# Patient Record
Sex: Male | Born: 1940 | Race: White | Hispanic: No | Smoking: Never smoker
Health system: Southern US, Community
[De-identification: ages and names within clinical notes are randomized; demographics above are authoritative.]

## PROBLEM LIST (undated history)

## (undated) DIAGNOSIS — I10 Essential (primary) hypertension: Secondary | ICD-10-CM

## (undated) DIAGNOSIS — M1990 Unspecified osteoarthritis, unspecified site: Secondary | ICD-10-CM

## (undated) DIAGNOSIS — F0390 Unspecified dementia without behavioral disturbance: Secondary | ICD-10-CM

## (undated) DIAGNOSIS — E079 Disorder of thyroid, unspecified: Secondary | ICD-10-CM

## (undated) HISTORY — PX: BACK SURGERY: SHX140

## (undated) HISTORY — PX: JOINT REPLACEMENT: SHX530

## (undated) HISTORY — PX: ROTATOR CUFF REPAIR: SHX139

## (undated) HISTORY — PX: CHOLECYSTECTOMY: SHX55

---

## 2016-11-23 ENCOUNTER — Ambulatory Visit
Admission: EM | Admit: 2016-11-23 | Discharge: 2016-11-23 | Disposition: A | Discharge status: Home / Self Care | Payer: Medicare Other | Attending: Internal Medicine | Admitting: Internal Medicine

## 2016-11-23 ENCOUNTER — Encounter: Payer: Self-pay | Admitting: *Deleted

## 2016-11-23 DIAGNOSIS — L0291 Cutaneous abscess, unspecified: Secondary | ICD-10-CM

## 2016-11-23 HISTORY — DX: Essential (primary) hypertension: I10

## 2016-11-23 HISTORY — DX: Disorder of thyroid, unspecified: E07.9

## 2016-11-23 HISTORY — DX: Unspecified osteoarthritis, unspecified site: M19.90

## 2016-11-23 MED ORDER — HYDROCODONE-ACETAMINOPHEN 5-325 MG PO TABS: 1.0000 | ORAL_TABLET | Freq: Four times a day (QID) | ORAL | 0 refills | Status: DC | PRN

## 2016-11-23 MED ORDER — DOXYCYCLINE HYCLATE 100 MG PO CAPS: 100.0000 mg | ORAL_CAPSULE | Freq: Two times a day (BID) | ORAL | 0 refills | Status: DC

## 2016-11-23 NOTE — ED Triage Notes (Signed)
Abcess to base of neck right shoulder. Pt states abcess has been there for years but just recently become painful and much larger.

## 2016-11-23 NOTE — ED Provider Notes (Signed)
CSN: [id number]     Arrival date & time 11/23/16  [id number] History   First MD Initiated Contact with Patient 11/23/16 0920     Chief Complaint  Patient presents with  . Abscess   (Consider location/radiation/quality/duration/timing/severity/associated sxs/prior Treatment) HPI  This a pleasant 75 year old male accompanied by his wife who are visiting from [place] who presents with a abscess on the back of his neck on the right. His wife states that he has had this abscess for 20 years or so that has never been this bothersome or painful. She has been attempting to squeeze it and has been able to express some pus but it is only worsened. He denies any fever or chills. They're planning to return to [place] today      Past Medical History:  Diagnosis Date  . Arthritis   . Hypertension   . Thyroid disease    Past Surgical History:  Procedure Laterality Date  . BACK SURGERY    . CHOLECYSTECTOMY    . JOINT REPLACEMENT     History reviewed. No pertinent family history. Social History  Substance Use Topics  . Smoking status: Never Smoker  . Smokeless tobacco: Never Used  . Alcohol use Yes    Review of Systems  Constitutional: Negative for activity change, appetite change, chills, fatigue and fever.  Skin: Positive for wound.  All other systems reviewed and are negative.   Allergies  Aspirin and Septra [sulfamethoxazole-trimethoprim]  Home Medications   Prior to Admission medications   Medication Sig Start Date End Date Taking? Authorizing Provider  allopurinol (ZYLOPRIM) 300 MG tablet Take 300 mg by mouth daily.   Yes Historical Provider, MD  levothyroxine (SYNTHROID, LEVOTHROID) 75 MCG tablet Take 75 mcg by mouth daily before breakfast.   Yes Historical Provider, MD  lisinopril (PRINIVIL,ZESTRIL) 10 MG tablet Take 10 mg by mouth daily.   Yes Historical Provider, MD  doxycycline (VIBRAMYCIN) 100 MG capsule Take 1 capsule (100 mg total) by mouth 2 (two) times daily.  11/23/16   Lutricia FeilWilliam P Roemer, PA-C  HYDROcodone-acetaminophen (NORCO/VICODIN) 5-325 MG tablet Take 1 tablet by mouth every 6 (six) hours as needed for severe pain. 11/23/16   Lutricia FeilWilliam P Roemer, PA-C   Meds Ordered and Administered this Visit  Medications - No data to display  BP 132/71 (BP Location: Left Arm)   Pulse 66   Temp 98.1 F (36.7 C) (Oral)   Resp 16   Ht 5\' 8"  (1.727 m)   Wt 181 lb (82.1 kg)   SpO2 99%   BMI 27.52 kg/m  No data found.   Physical Exam  Constitutional: He is oriented to person, place, and time. He appears well-developed and well-nourished. No distress.  HENT:  Head: Normocephalic and atraumatic.  Right Ear: External ear normal.  Left Ear: External ear normal.  Nose: Nose normal.  Mouth/Throat: Oropharynx is clear and moist.  Eyes: EOM are normal. Pupils are equal, round, and reactive to light.  Neck: Normal range of motion. Neck supple.  Musculoskeletal: Normal range of motion. He exhibits no edema.  Neurological: He is alert and oriented to person, place, and time.  Skin: Skin is warm and dry. He is not diaphoretic.  Examination of the right posterior neck just superior border of the right trapezial muscle is a tense but fluctuant indurated cyst with several small pustules over the surface. It is warm. No purulence is able to be expressed digitally. It measures approximately 2-1/2 cm in diameter with more fluctuance  extending towards the midline.  Psychiatric: He has a normal mood and affect. His behavior is normal. Judgment and thought content normal.  Nursing note and vitals reviewed.   Urgent Care Course   Clinical Course     .Marland KitchenIncision and Drainage Date/Time: 11/23/2016 10:07 AM Performed by: Lutricia Feil Authorized by: Eustace Moore   Consent:    Consent obtained:  Verbal   Consent given by:  Patient   Risks discussed:  Bleeding, incomplete drainage, pain and infection   Alternatives discussed:  Alternative treatment and  referral Location:    Type:  Abscess   Size:  Centimeter in diameter large indurated fluctuant abscess with several smaller pustules on the surface. The induration high and fluctuance extends towards midline for approximately one half more centimeters.    Location:  Neck   Neck location:  R posterior Pre-procedure details:    Skin preparation:  Betadine Anesthesia (see MAR for exact dosages):    Anesthesia method:  Local infiltration   Local anesthetic:  Lidocaine 1% w/o epi Procedure type:    Complexity:  Complex Procedure details:    Needle aspiration: no     Incision types:  Stab incision and single straight   Incision depth:  Subcutaneous   Scalpel blade:  11   Wound management:  Probed and deloculated   Drainage:  Bloody and purulent   Drainage amount:  Moderate   Wound treatment:  Wound left open and drain placed   Packing materials:  1/4 in gauze   Amount 1/4":  Approximately 4 inches Post-procedure details:    Patient tolerance of procedure:  Tolerated well, no immediate complications Comments:     Patient will keep the area clean and dry for the next 48 hours. We are returning to their home in [facility] and will follow up with their primary care or urgent care in that area. Advised them that they will need to have this reevaluated in several days effectiveness of the antibiotic as well as for removal and possible repacking due to the size and the depth of the abscess. I have given him 6 hydrocodone for pain to be used as necessary. They will try to control pain with Tylenol plain if possible. We'll start him on doxycycline since he has an allergy to Septra to augment the drainage of the abscess since it was so large and deep. If he develops any fever or chills he will go to the emergency room immediately. The patient tolerated the procedure well and left the facility in stable condition   (including critical care time)  Labs Review Labs Reviewed - No data to  display  Imaging Review No results found.   Visual Acuity Review  Right Eye Distance:   Left Eye Distance:   Bilateral Distance:    Right Eye Near:   Left Eye Near:    Bilateral Near:         MDM   1. Abscess    Discharge Medication List as of 11/23/2016 10:01 AM    START taking these medications   Details  doxycycline (VIBRAMYCIN) 100 MG capsule Take 1 capsule (100 mg total) by mouth 2 (two) times daily., Starting Tue 11/23/2016, Print    HYDROcodone-acetaminophen (NORCO/VICODIN) 5-325 MG tablet Take 1 tablet by mouth every 6 (six) hours as needed for severe pain., Starting Tue 11/23/2016, Print      Plan: 1. Test/x-ray results and diagnosis reviewed with patient 2. rx as per orders; risks, benefits, potential side  effects reviewed with patient 3. Recommend supportive treatment with Even clean and dry augmenting the dressings if they become bloodsoaked. Is any question that should follow-up with an emergency room. Otherwise he should receive additional care in his home town from an urgent care or his primary care physician within 48 hours 4. F/u prn if symptoms worsen or don't improve     Lutricia FeilWilliam P Roemer, PA-C 11/23/16 1014

## 2017-06-06 ENCOUNTER — Other Ambulatory Visit: Payer: Self-pay | Admitting: Orthopedic Surgery

## 2017-06-06 DIAGNOSIS — G8929 Other chronic pain: Secondary | ICD-10-CM

## 2017-06-06 DIAGNOSIS — M545 Low back pain, unspecified: Secondary | ICD-10-CM

## 2017-06-06 DIAGNOSIS — M5440 Lumbago with sciatica, unspecified side: Secondary | ICD-10-CM

## 2017-06-13 ENCOUNTER — Ambulatory Visit
Admission: RE | Admit: 2017-06-13 | Discharge: 2017-06-13 | Disposition: A | Discharge status: Home / Self Care | Payer: Medicare Other | Source: Ambulatory Visit | Attending: Orthopedic Surgery | Admitting: Orthopedic Surgery

## 2017-06-13 DIAGNOSIS — G8929 Other chronic pain: Secondary | ICD-10-CM | POA: Insufficient documentation

## 2017-06-13 DIAGNOSIS — M545 Low back pain, unspecified: Secondary | ICD-10-CM

## 2017-06-13 DIAGNOSIS — M47816 Spondylosis without myelopathy or radiculopathy, lumbar region: Secondary | ICD-10-CM | POA: Diagnosis not present

## 2017-06-13 DIAGNOSIS — M5440 Lumbago with sciatica, unspecified side: Secondary | ICD-10-CM | POA: Diagnosis not present

## 2017-06-13 DIAGNOSIS — M48061 Spinal stenosis, lumbar region without neurogenic claudication: Secondary | ICD-10-CM | POA: Diagnosis not present

## 2017-06-13 DIAGNOSIS — M5127 Other intervertebral disc displacement, lumbosacral region: Secondary | ICD-10-CM | POA: Insufficient documentation

## 2018-01-13 IMAGING — MR MR LUMBAR SPINE W/O CM
5 series · 37 of 48 positions shown · non-contrast
Comparison: None.

CLINICAL DATA: Previous injury 30 years ago.  Pain.

EXAM:
MRI LUMBAR SPINE WITHOUT CONTRAST
TECHNIQUE: Multiplanar, multisequence MR imaging of the lumbar spine was
performed. No intravenous contrast was administered.

[Series 2: T2 · sagittal · 4.0mm · 0.81mm/px · 6 of 15 slices shown (1 of 2)]
[im 1/15]
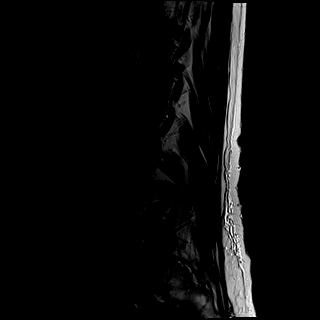
[im 3/15]
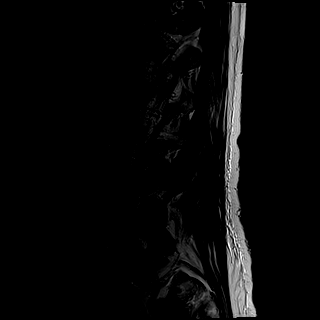
[im 6/15]
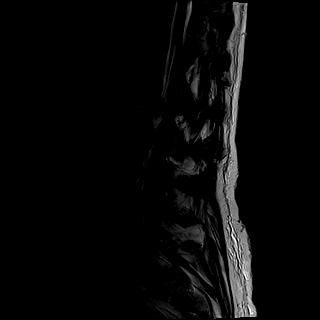
[im 9/15]
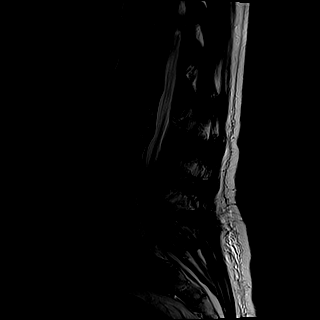
[im 12/15]
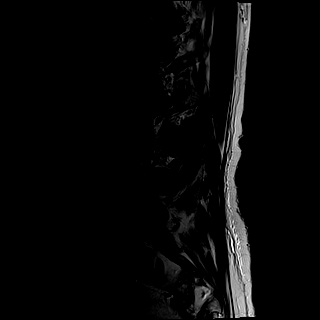
[im 15/15]
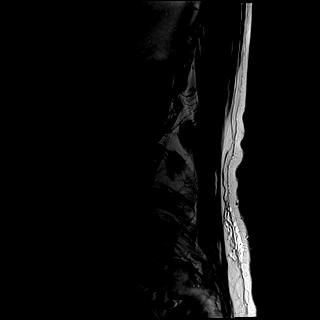

[Series 3: T1 · sagittal · 4.0mm · 0.81mm/px · 6 of 15 slices shown (1 of 2)]
[im 1/15]
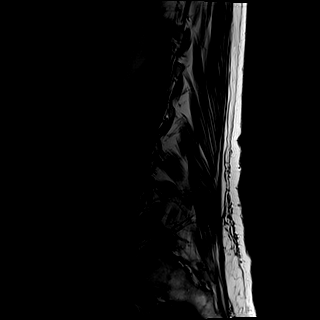
[im 3/15]
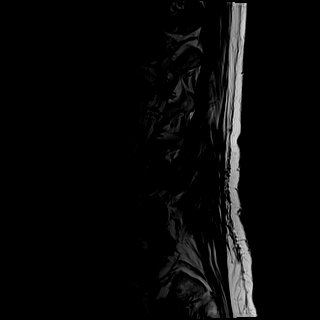
[im 6/15]
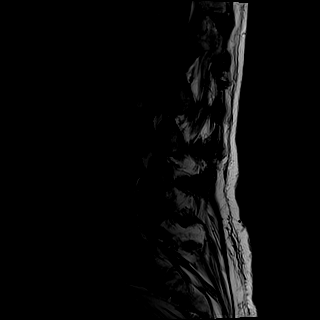
[im 9/15]
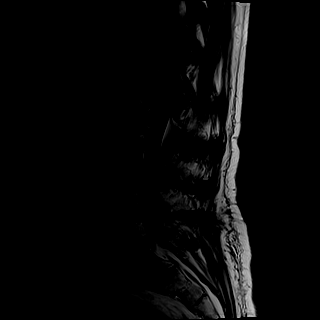
[im 12/15]
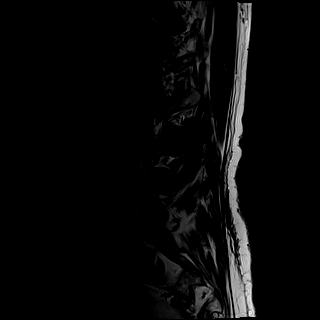
[im 15/15]
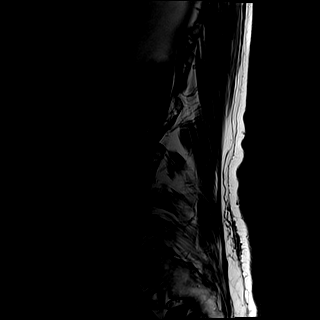

[Series 4: STIR · sagittal · 4.0mm · 1.02mm/px · 6 of 15 slices shown]
[im 1/15]
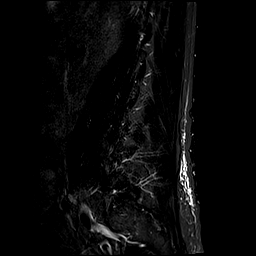
[im 3/15]
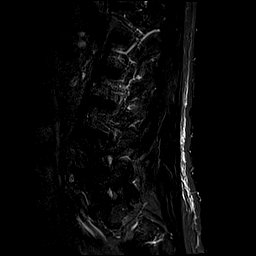
[im 6/15]
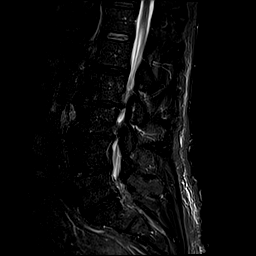
[im 9/15]
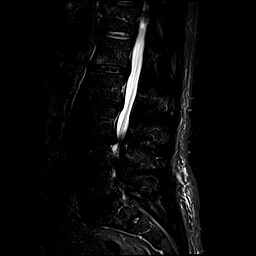
[im 12/15]
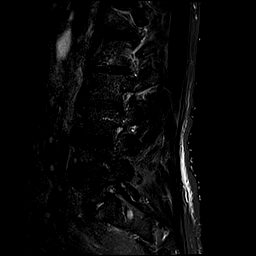
[im 15/15]
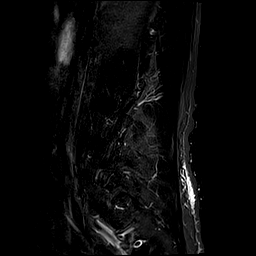

[Series 5: T2 · axial · 4.0mm · 0.78mm/px · z∈[-126,+97]mm · 10 of 39 slices shown (2 of 2)]
[im 1/39]
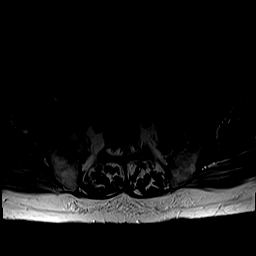
[im 3/39]
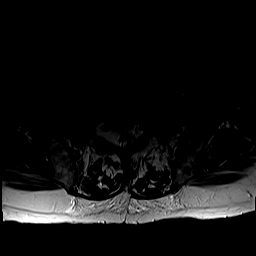
[im 6/39]
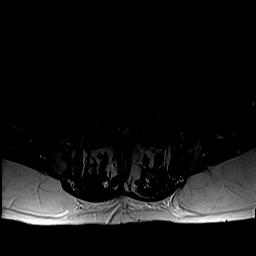
[im 11/39]
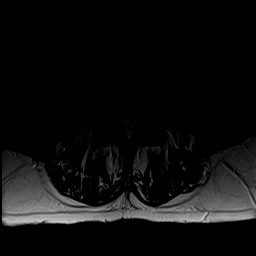
[im 17/39]
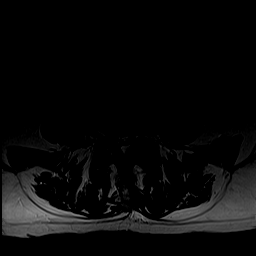
[im 20/39]
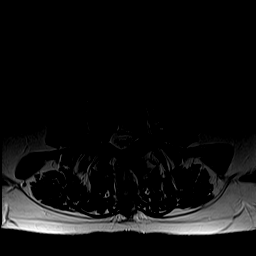
[im 22/39]
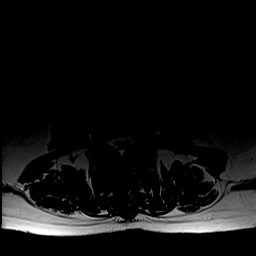
[im 28/39]
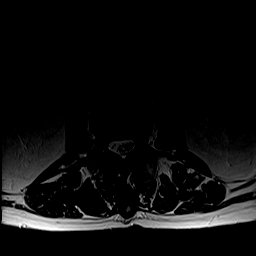
[im 33/39]
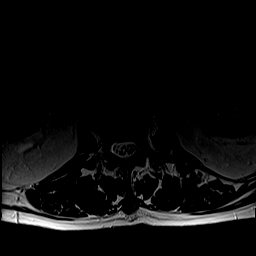
[im 39/39]
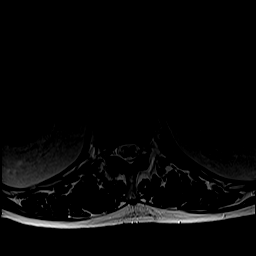

[Series 6: T1 · axial · 4.0mm · 0.39mm/px · z∈[-126,+97]mm · 9 of 39 slices shown (2 of 2)]
[im 1/39]
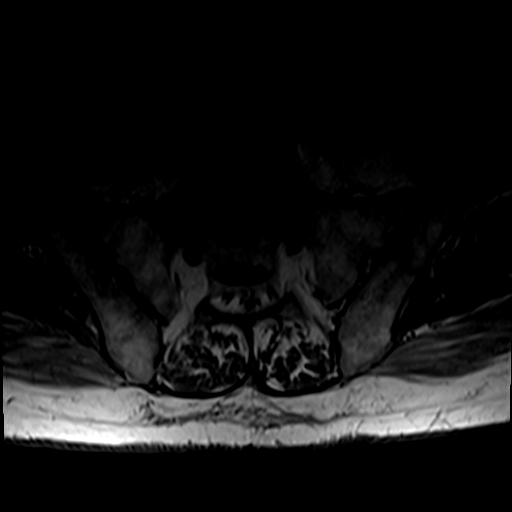
[im 6/39]
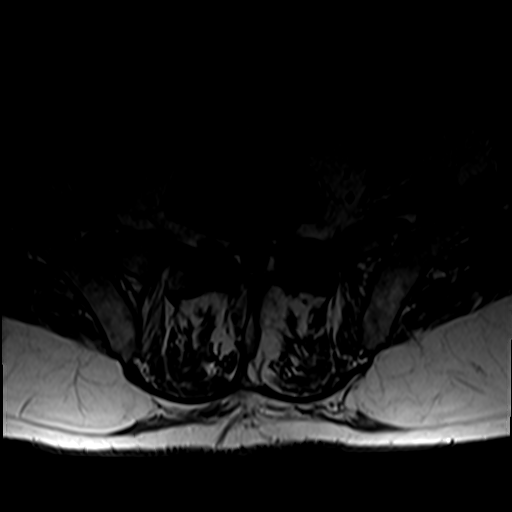
[im 11/39]
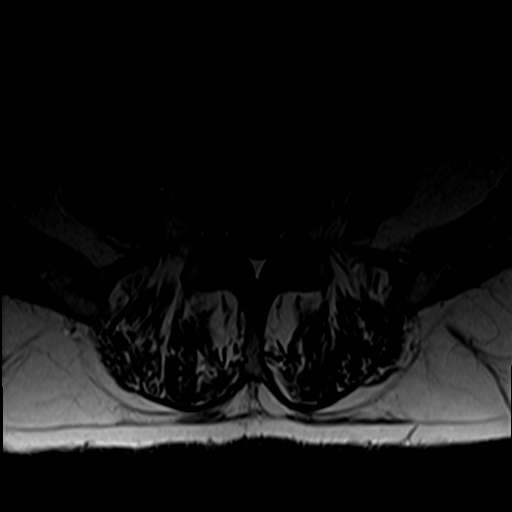
[im 17/39]
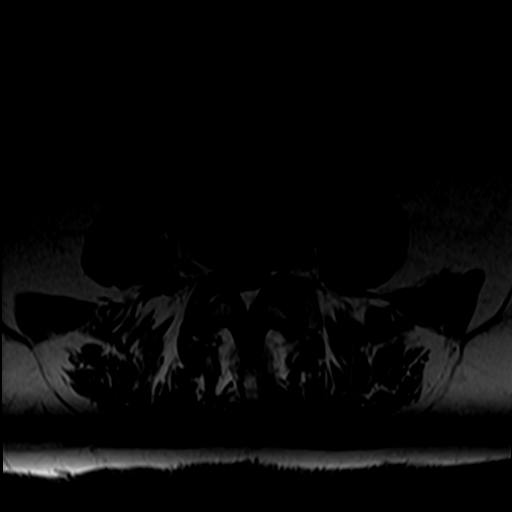
[im 20/39]
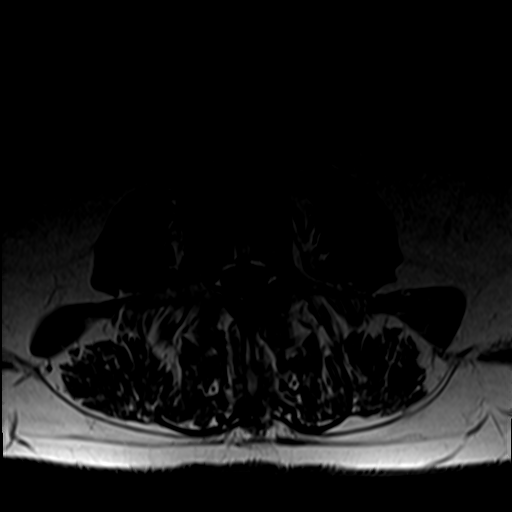
[im 22/39]
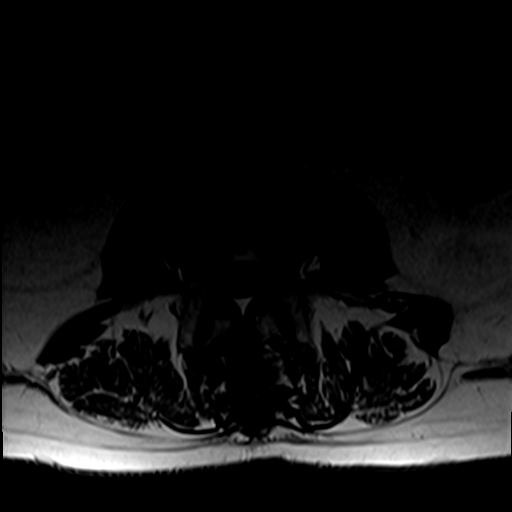
[im 28/39]
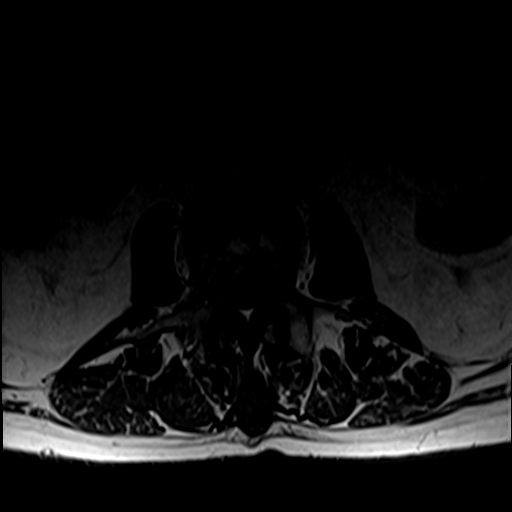
[im 33/39]
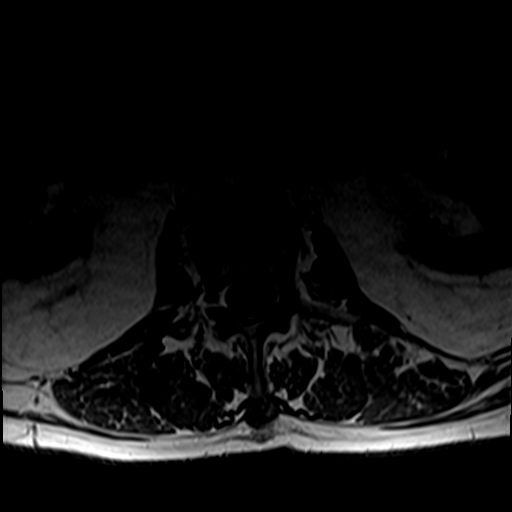
[im 39/39]
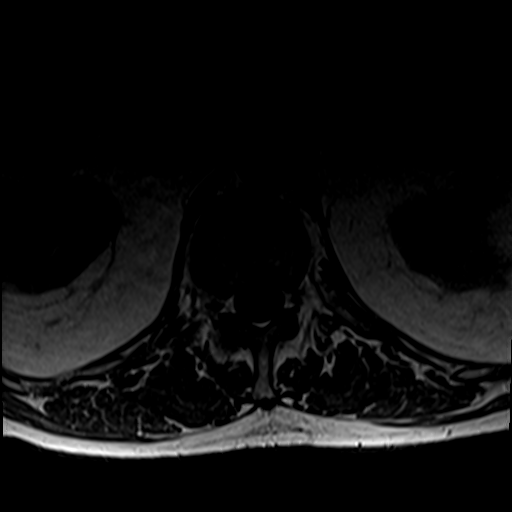

[37 of 48 positions shown; findings below may reference images not displayed]

FINDINGS: Segmentation:  Standard.

Alignment: 2 mm retrolisthesis L2-3 and L3-4. This is facet
mediated.

Vertebrae: Degenerative endplate change. No worrisome osseous
lesion.

Conus medullaris: Extends to the L1 level and appears normal.

Paraspinal and other soft tissues: No hydronephrosis. Intraspinous
bursa edema at L2-3 and L3-4.

Disc levels:

L1-L2:  Annular bulge.  Facet arthropathy.  No impingement.

L2-L3: Disc space narrowing. 2 mm retrolisthesis. Far-lateral and
foraminal protrusion on the LEFT. Posterior element hypertrophy.
LEFT L2 and LEFT L3 nerve root impingement are noted.

L3-L4: Disc space narrowing. 2 mm retrolisthesis. Central and
rightward protrusion. Posterior element hypertrophy. Moderate to
severe stenosis. RIGHT greater than LEFT L4 nerve root impingement
due to subarticular zone narrowing. No significant foraminal
narrowing.

L4-L5: Disc space narrowing. Osseous spurring. Central protrusion.
Posterior element hypertrophy. Moderate to severe stenosis.
BILATERAL subarticular zone narrowing affecting the L5 nerve roots.
There is a foraminal and extraforaminal synovial cyst on the RIGHT,
5 x 10 mm cross-section, resulting in RIGHT L4 nerve root
compression.

L5-S1: Annular rent. Central and rightward protrusion. Posterior
element hypertrophy. LEFT laminotomy. Possible BILATERAL S1 nerve
root irritation, worse on the RIGHT.
IMPRESSION: Multilevel spondylosis of a moderate to severe nature. Potentially
symptomatic neural impingement from L2-3 through L5-S1. Discussion
above.

## 2022-10-19 ENCOUNTER — Encounter (INDEPENDENT_AMBULATORY_CARE_PROVIDER_SITE_OTHER): Payer: Medicare PPO | Admitting: Ophthalmology

## 2022-10-19 DIAGNOSIS — H43813 Vitreous degeneration, bilateral: Secondary | ICD-10-CM | POA: Diagnosis not present

## 2022-10-19 DIAGNOSIS — H35033 Hypertensive retinopathy, bilateral: Secondary | ICD-10-CM | POA: Diagnosis not present

## 2022-10-19 DIAGNOSIS — H353134 Nonexudative age-related macular degeneration, bilateral, advanced atrophic with subfoveal involvement: Secondary | ICD-10-CM

## 2022-10-19 DIAGNOSIS — H26492 Other secondary cataract, left eye: Secondary | ICD-10-CM

## 2022-10-19 DIAGNOSIS — I10 Essential (primary) hypertension: Secondary | ICD-10-CM | POA: Diagnosis not present

## 2022-11-02 ENCOUNTER — Encounter (INDEPENDENT_AMBULATORY_CARE_PROVIDER_SITE_OTHER): Payer: Medicare PPO | Admitting: Ophthalmology

## 2023-01-09 ENCOUNTER — Emergency Department (HOSPITAL_COMMUNITY)
Admission: EM | Admit: 2023-01-09 | Discharge: 2023-01-09 | Disposition: A | Discharge status: Home / Self Care | Payer: Medicare PPO | Attending: Emergency Medicine | Admitting: Emergency Medicine

## 2023-01-09 ENCOUNTER — Encounter (HOSPITAL_COMMUNITY): Payer: Self-pay | Admitting: *Deleted

## 2023-01-09 ENCOUNTER — Other Ambulatory Visit: Payer: Self-pay

## 2023-01-09 DIAGNOSIS — I10 Essential (primary) hypertension: Secondary | ICD-10-CM | POA: Insufficient documentation

## 2023-01-09 DIAGNOSIS — T50911A Poisoning by multiple unspecified drugs, medicaments and biological substances, accidental (unintentional), initial encounter: Secondary | ICD-10-CM | POA: Insufficient documentation

## 2023-01-09 DIAGNOSIS — Z79899 Other long term (current) drug therapy: Secondary | ICD-10-CM | POA: Diagnosis not present

## 2023-01-09 DIAGNOSIS — T50901A Poisoning by unspecified drugs, medicaments and biological substances, accidental (unintentional), initial encounter: Secondary | ICD-10-CM

## 2023-01-09 LAB — BASIC METABOLIC PANEL
Anion gap: 8 (ref 5–15)
BUN: 15 mg/dL (ref 8–23)
CO2: 25 mmol/L (ref 22–32)
Calcium: 8.9 mg/dL (ref 8.9–10.3)
Chloride: 105 mmol/L (ref 98–111)
Creatinine, Ser: 0.9 mg/dL (ref 0.61–1.24)
GFR, Estimated: >60 mL/min (ref >60)
Glucose, Bld: 143 mg/dL — ABNORMAL HIGH (ref 70–99)
Potassium: 4.3 mmol/L (ref 3.5–5.1)
Sodium: 138 mmol/L (ref 135–145)

## 2023-01-09 LAB — CBC
HCT: 45.8 % (ref 39.0–52.0)
Hemoglobin: 14.8 g/dL (ref 13.0–17.0)
MCH: 30.7 pg (ref 26.0–34.0)
MCHC: 32.3 g/dL (ref 30.0–36.0)
MCV: 95 fL (ref 80.0–100.0)
Platelets: 196 10*3/uL (ref 150–400)
RBC: 4.82 MIL/uL (ref 4.22–5.81)
RDW: 14 % (ref 11.5–15.5)
WBC: 6.7 10*3/uL (ref 4.0–10.5)
nRBC: 0 % (ref 0.0–0.2)

## 2023-01-09 MED ORDER — SODIUM CHLORIDE 0.9 % IV BOLUS
500.0000 mL | Freq: Once | INTRAVENOUS | Status: AC
  Administered 2023-01-09: 500 mL via INTRAVENOUS

## 2023-01-09 NOTE — ED Triage Notes (Signed)
Pt was accidentally given his wife's medications as well as his this am, wife states that pt was given Donepezil 10 mg Levothyroxine 75 mcg Lisinopril 10 mg Memantine 10 mg  Allopurinol 100 mg Prevastatin 40 mg this am which are all his medications as well as hers which are Atorvastin 40 mg Protonix 40 mg Aspirin 81 mg Clopidogrel 75 mg Metoprolol 25 mg Isosorbide 60 mg Lisinopril 20 mg Ranolazine ER 1000 mg   Pt denies any symptoms, wife states that she contacted poison control and was advised to come into the er.

## 2023-01-09 NOTE — ED Provider Notes (Addendum)
[facility] Provider Note   CSN: [id number] Arrival date & time: 01/09/23  0941     History  Chief Complaint  Patient presents with   Drug Overdose    Gregg Summers is a 82 y.o. adult.   Drug Overdose  Patient presented after accidental overdose.  Took patient's own medications and his wife medications due to a mixup.  Patient without complaint.  No lightheadedness or dizziness.  Family had discussed with poison control and told to come into the ER.  Poison control called ER with recommendations.  Poison Control called prior to pt's arrival to notify us that pt was coming in. Their main concern was because pt took his wife's Isosorbide Mononitrate 79m 2 tabs=121m They said to watch for bradycardia and orthostasis. They recommend EKG, cardiac monitor, supportive care and to monitor the pt for a minimum of 6 hours. At the 6 hour mark, assess the pt for stable vital signs, ambulating with a stable gait on his own, and orthostasis.   These are the medications the patient took.  Donepezil 10 mg Levothyroxine 75 mcg Lisinopril 10 mg Memantine 10 mg  Allopurinol 100 mg Prevastatin 40 mg this am which are all his medications as well as hers which are Atorvastin 40 mg Protonix 40 mg Aspirin 81 mg Clopidogrel 75 mg Metoprolol 25 mg Isosorbide 60 mg Lisinopril 20 mg Ranolazine ER 1000 mg   Medication taken about 30 minutes prior to arrival.    Past Medical History:  Diagnosis Date   Arthritis    Hypertension    Thyroid disease     Home Medications Prior to Admission medications   Medication Sig Start Date End Date Taking? Authorizing Provider  allopurinol (ZYLOPRIM) 100 MG tablet Take 200 mg by mouth daily.   Yes [provider]  donepezil (ARICEPT) 10 MG tablet Take 10 mg by mouth daily.   Yes [provider]  Glucosamine-Chondroitin (OSTEO BI-FLEX REGULAR STRENGTH PO) Take 2 capsules by mouth daily.    Yes [provider]  levothyroxine (SYNTHROID, LEVOTHROID) 75 MCG tablet Take 75 mcg by mouth daily before breakfast.   Yes [provider]  lisinopril (PRINIVIL,ZESTRIL) 10 MG tablet Take 10 mg by mouth daily.   Yes [provider]  memantine (NAMENDA) 10 MG tablet Take 10 mg by mouth 2 (two) times daily.   Yes [provider]  Multiple Vitamin (MULTI-VITAMIN) tablet Take 2 tablets by mouth daily.   Yes [provider]  pravastatin (PRAVACHOL) 40 MG tablet Take 40 mg by mouth daily. 12/17/22  Yes [provider]      Allergies    Aspirin and Septra [sulfamethoxazole-trimethoprim]    Review of Systems   Review of Systems  Physical Exam Updated Vital Signs BP 105/61   Pulse 61   Temp 97.6 F (36.4 C) (Oral)   Resp 16   Ht 5' 9"$  (1.753 m)   Wt 77.1 kg   SpO2 96%   BMI 25.10 kg/m  Physical Exam Vitals and nursing note reviewed.  HENT:     Head: Atraumatic.  Cardiovascular:     Rate and Rhythm: Regular rhythm. Bradycardia present.  Skin:    General: Skin is warm.  Neurological:     Mental Status: She is alert. Mental status is at baseline.     ED Results / Procedures / Treatments   Labs (all labs ordered are listed, but only abnormal results are displayed) Labs Reviewed  BASIC  METABOLIC PANEL - Abnormal; Notable for the following components:      Result Value   Glucose, Bld 143 (*)    All other components within normal limits  CBC    EKG EKG Interpretation  Date/Time:  Sunday January 09 2023 10:03:14 EST Ventricular Rate:  54 PR Interval:  144 QRS Duration: 150 QT Interval:  519 QTC Calculation: 492 R Axis:   -88 Text Interpretation: Sinus or ectopic atrial rhythm Right bundle branch block Inferior infarct, old No old tracing to compare Confirmed by Davonna Belling [phone]) on 01/09/2023 10:08:34 AM  Radiology No results found.  Procedures Procedures    Medications Ordered in ED Medications   sodium chloride 0.9 % bolus 500 mL (0 mLs Intravenous Stopped 01/09/23 1401)    ED Course/ Medical Decision Making/ A&P                             Medical Decision Making Amount and/or Complexity of Data Reviewed Labs: ordered.   Patient with accidental overdose.  Took wife's medications in addition to his own.  Poison control has been contacted.  At this point patient is asymptomatic but will watch for bradycardia and hypotension/orthostasis.  Will give small fluid bolus since blood pressure on the lower side.  Will need to monitor for at least 6 hours.  1100 reevaluated patient.  Still asymptomatic.  Blood pressure still on the lower side with some bradycardia.  No lightheadedness or dizziness at rest.  1315 reevaluated again.  Still without symptoms.  Has food by him to eat.  Still tending towards bradycardia with heart rates in the low 50s.  Still some hypotension.  Will continue to monitor.   With continued monitoring blood pressure still remains on the lower side.  Most recently heart rate is improved.  Continues to be asymptomatic.  Care turned over to oncoming provider.  CRITICAL CARE Performed by: Davonna Belling Total critical care time: 30 minutes Critical care time was exclusive of separately billable procedures and treating other patients. Critical care was necessary to treat or prevent imminent or life-threatening deterioration. Critical care was time spent personally by me on the following activities: development of treatment plan with patient and/or surrogate as well as nursing, discussions with consultants, evaluation of patient's response to treatment, examination of patient, obtaining history from patient or surrogate, ordering and performing treatments and interventions, ordering and review of laboratory studies, ordering and review of radiographic studies, pulse oximetry and re-evaluation of patient's condition.         Final Clinical Impression(s) / ED  Diagnoses Final diagnoses:  Accidental overdose, initial encounter    Rx / DC Orders ED Discharge Orders     None         Davonna Belling, MD 01/09/23 1505    Davonna Belling, MD 01/09/23 1505

## 2023-01-09 NOTE — ED Provider Notes (Signed)
Pt signed out by Dr. Alvino Chapel pending symptomatic improvement.  Pt has been observed for 7 hours.  BP is better.  He is not orthostatic and feels fine when ambulating.  He said he's ready to go home.  Is is medically clear and stable for d/c.  Return if worse.  F/u with pcp.   Isla Pence, MD 01/09/23 [phone]

## 2023-01-09 NOTE — ED Notes (Signed)
Poison control Janett Billow called, states activated charcoal 1g/ kg can max 75g can be used to counteract meds. Per MD discretion.

## 2023-01-09 NOTE — ED Notes (Signed)
Poison Control called prior to pt's arrival to notify us that pt was coming in. Their main concern was because pt took his wife's Isosorbide Mononitrate 49m 2 tabs=12103m They said to watch for bradycardia and orthostasis. They recommend EKG, cardiac monitor, supportive care and to monitor the pt for a minimum of 6 hours. At the 6 hour mark, assess the pt for stable vital signs, ambulating with a stable gait on his own, and orthostasis.

## 2023-02-27 ENCOUNTER — Encounter (HOSPITAL_COMMUNITY): Payer: Self-pay | Admitting: *Deleted

## 2023-02-27 ENCOUNTER — Emergency Department (HOSPITAL_COMMUNITY): Payer: Medicare PPO

## 2023-02-27 ENCOUNTER — Other Ambulatory Visit: Payer: Self-pay

## 2023-02-27 ENCOUNTER — Emergency Department (HOSPITAL_COMMUNITY)
Admission: EM | Admit: 2023-02-27 | Discharge: 2023-02-27 | Disposition: A | Discharge status: Home / Self Care | Payer: Medicare PPO | Attending: Emergency Medicine | Admitting: Emergency Medicine

## 2023-02-27 DIAGNOSIS — F0390 Unspecified dementia without behavioral disturbance: Secondary | ICD-10-CM | POA: Diagnosis not present

## 2023-02-27 DIAGNOSIS — E039 Hypothyroidism, unspecified: Secondary | ICD-10-CM | POA: Insufficient documentation

## 2023-02-27 DIAGNOSIS — R109 Unspecified abdominal pain: Secondary | ICD-10-CM | POA: Insufficient documentation

## 2023-02-27 DIAGNOSIS — R0602 Shortness of breath: Secondary | ICD-10-CM | POA: Insufficient documentation

## 2023-02-27 DIAGNOSIS — I517 Cardiomegaly: Secondary | ICD-10-CM | POA: Insufficient documentation

## 2023-02-27 DIAGNOSIS — I119 Hypertensive heart disease without heart failure: Secondary | ICD-10-CM | POA: Diagnosis not present

## 2023-02-27 DIAGNOSIS — Z79899 Other long term (current) drug therapy: Secondary | ICD-10-CM | POA: Insufficient documentation

## 2023-02-27 DIAGNOSIS — R7989 Other specified abnormal findings of blood chemistry: Secondary | ICD-10-CM | POA: Diagnosis not present

## 2023-02-27 DIAGNOSIS — I10 Essential (primary) hypertension: Secondary | ICD-10-CM | POA: Insufficient documentation

## 2023-02-27 HISTORY — DX: Unspecified dementia, unspecified severity, without behavioral disturbance, psychotic disturbance, mood disturbance, and anxiety: F03.90

## 2023-02-27 LAB — CBC WITH DIFFERENTIAL/PLATELET
Abs Immature Granulocytes: 0.02 10*3/uL (ref 0.00–0.07)
Basophils Absolute: 0 10*3/uL (ref 0.0–0.1)
Basophils Relative: 1 %
Eosinophils Absolute: 0.2 10*3/uL (ref 0.0–0.5)
Eosinophils Relative: 3 %
HCT: 47.5 % (ref 39.0–52.0)
Hemoglobin: 15.1 g/dL (ref 13.0–17.0)
Immature Granulocytes: 0 %
Lymphocytes Relative: 22 %
Lymphs Abs: 1.4 10*3/uL (ref 0.7–4.0)
MCH: 31 pg (ref 26.0–34.0)
MCHC: 31.8 g/dL (ref 30.0–36.0)
MCV: 97.5 fL (ref 80.0–100.0)
Monocytes Absolute: 0.6 10*3/uL (ref 0.1–1.0)
Monocytes Relative: 9 %
Neutro Abs: 4.3 10*3/uL (ref 1.7–7.7)
Neutrophils Relative %: 65 %
Platelets: 174 10*3/uL (ref 150–400)
RBC: 4.87 MIL/uL (ref 4.22–5.81)
RDW: 14.9 % (ref 11.5–15.5)
WBC: 6.5 10*3/uL (ref 4.0–10.5)
nRBC: 0 % (ref 0.0–0.2)

## 2023-02-27 LAB — URINALYSIS, ROUTINE W REFLEX MICROSCOPIC
Bilirubin Urine: NEGATIVE
Glucose, UA: NEGATIVE mg/dL
Hgb urine dipstick: NEGATIVE
Ketones, ur: NEGATIVE mg/dL
Leukocytes,Ua: NEGATIVE
Nitrite: NEGATIVE
Protein, ur: NEGATIVE mg/dL
Specific Gravity, Urine: 1.023 (ref 1.005–1.030)
pH: 5 (ref 5.0–8.0)

## 2023-02-27 LAB — COMPREHENSIVE METABOLIC PANEL
ALT: 59 U/L — ABNORMAL HIGH (ref 0–44)
AST: 30 U/L (ref 15–41)
Albumin: 3.8 g/dL (ref 3.5–5.0)
Alkaline Phosphatase: 78 U/L (ref 38–126)
Anion gap: 8 (ref 5–15)
BUN: 16 mg/dL (ref 8–23)
CO2: 25 mmol/L (ref 22–32)
Calcium: 8.7 mg/dL — ABNORMAL LOW (ref 8.9–10.3)
Chloride: 105 mmol/L (ref 98–111)
Creatinine, Ser: 0.78 mg/dL (ref 0.61–1.24)
GFR, Estimated: >60 mL/min (ref >60)
Glucose, Bld: 101 mg/dL — ABNORMAL HIGH (ref 70–99)
Potassium: 4 mmol/L (ref 3.5–5.1)
Sodium: 138 mmol/L (ref 135–145)
Total Bilirubin: 1.5 mg/dL — ABNORMAL HIGH (ref 0.3–1.2)
Total Protein: 6.8 g/dL (ref 6.5–8.1)

## 2023-02-27 LAB — BRAIN NATRIURETIC PEPTIDE: B Natriuretic Peptide: 601 pg/mL — ABNORMAL HIGH (ref 0.0–100.0)

## 2023-02-27 LAB — LIPASE, BLOOD: Lipase: 34 U/L (ref 11–51)

## 2023-02-27 MED ORDER — LIDOCAINE 5 % EX PTCH
1.0000 | MEDICATED_PATCH | CUTANEOUS | Status: DC
  Administered 2023-02-27: 1 via TRANSDERMAL
  Filled 2023-02-27: qty 1

## 2023-02-27 NOTE — ED Triage Notes (Signed)
Pt with right flank pain x 3-4 days. Has tried TENS unit and Aleve without relief.  Denies any burning with urination.  Wife states pt does not drink enough. Wife states pt with dementia.

## 2023-02-27 NOTE — ED Provider Notes (Signed)
[facility] Provider Note   CSN: [id number] Arrival date & time: 02/27/23  1132     History  Chief Complaint  Patient presents with   Flank Pain    Gregg Summers is a 82 y.o. male.   Flank Pain     This is an 82 year old male with history of dementia, prior back surgery, hypothyroid, hypertension presenting to the emergency department due to right flank pain.  Symptoms started a few days ago, they are worse with movement.  He has not noticed any dysuria or hematuria.  No fevers, nausea, vomiting, abdominal pain.  Patient's wife endorses he did fall a few days ago and that aggravated the pain.  Does have chronic right-sided back pain but this has been worse.  No change in bladder or bowel function, no urinary retention, fecal incontinence, lower extremity weakness, saddle anesthesia.  Home Medications Prior to Admission medications   Medication Sig Start Date End Date Taking? Authorizing Provider  allopurinol (ZYLOPRIM) 100 MG tablet Take 200 mg by mouth daily.    [provider]  donepezil (ARICEPT) 10 MG tablet Take 10 mg by mouth daily.    [provider]  Glucosamine-Chondroitin (OSTEO BI-FLEX REGULAR STRENGTH PO) Take 2 capsules by mouth daily.    [provider]  levothyroxine (SYNTHROID, LEVOTHROID) 75 MCG tablet Take 75 mcg by mouth daily before breakfast.    [provider]  lisinopril (PRINIVIL,ZESTRIL) 10 MG tablet Take 10 mg by mouth daily.    [provider]  memantine (NAMENDA) 10 MG tablet Take 10 mg by mouth 2 (two) times daily.    [provider]  Multiple Vitamin (MULTI-VITAMIN) tablet Take 2 tablets by mouth daily.    [provider]  pravastatin (PRAVACHOL) 40 MG tablet Take 40 mg by mouth daily. 12/17/22   [provider]      Allergies    Aspirin and Septra [sulfamethoxazole-trimethoprim]    Review of Systems   Review of Systems   Genitourinary:  Positive for flank pain.    Physical Exam Updated Vital Signs BP 122/70 (BP Location: Right Arm)   Pulse 78   Temp 97.9 F (36.6 C) (Oral)   Resp 16   Ht 5\' 9"  (1.753 m)   Wt 79.4 kg   SpO2 98%   BMI 25.84 kg/m  Physical Exam Vitals and nursing note reviewed. Exam conducted with a chaperone present.  Constitutional:      Appearance: Normal appearance.  HENT:     Head: Normocephalic and atraumatic.  Eyes:     General: No scleral icterus.       Right eye: No discharge.        Left eye: No discharge.     Extraocular Movements: Extraocular movements intact.     Pupils: Pupils are equal, round, and reactive to light.  Cardiovascular:     Rate and Rhythm: Normal rate and regular rhythm.     Pulses: Normal pulses.     Heart sounds: Normal heart sounds.     No friction rub. No gallop.  Pulmonary:     Effort: Pulmonary effort is normal. No respiratory distress.     Breath sounds: Normal breath sounds.  Abdominal:     General: Abdomen is flat. Bowel sounds are normal. There is no distension.     Palpations: Abdomen is soft.     Tenderness: There is no abdominal tenderness. There is right CVA tenderness.     Comments:  Abdomen is soft nontender  Skin:    General: Skin is warm and dry.     Coloration: Skin is not jaundiced.  Neurological:     Mental Status: He is alert. Mental status is at baseline.     Coordination: Coordination normal.     Comments: Follows commands, pleasantly demented.  Ambulatory steady gait, upper and lower extremity strength is symmetric bilaterally.  No dysarthria.     ED Results / Procedures / Treatments   Labs (all labs ordered are listed, but only abnormal results are displayed) Labs Reviewed  COMPREHENSIVE METABOLIC PANEL - Abnormal; Notable for the following components:      Result Value   Glucose, Bld 101 (*)    Calcium 8.7 (*)    ALT 59 (*)    Total Bilirubin 1.5 (*)    All other components within normal limits  BRAIN  NATRIURETIC PEPTIDE - Abnormal; Notable for the following components:   B Natriuretic Peptide 601.0 (*)    All other components within normal limits  CBC WITH DIFFERENTIAL/PLATELET  URINALYSIS, ROUTINE W REFLEX MICROSCOPIC  LIPASE, BLOOD    EKG EKG Interpretation  Date/Time:  Sunday February 27 2023 13:39:19 EDT Ventricular Rate:  65 PR Interval:  190 QRS Duration: 138 QT Interval:  468 QTC Calculation: 486 R Axis:   -72 Text Interpretation: Sinus rhythm with Premature atrial complexes with Abberant conduction Left axis deviation Right bundle branch block Abnormal ECG When compared with ECG of 09-Jan-2023 10:03, No significant change since last tracing Confirmed by Aletta Edouard [phone]) on 02/27/2023 1:42:42 PM  Radiology DG Chest Port 1 View  Result Date: 02/27/2023 CLINICAL DATA:  Shortness of breath with right flank pain for 3-4 days. EXAM: PORTABLE CHEST 1 VIEW COMPARISON:  None Available. FINDINGS: 1448 hours. Lordotic positioning. The heart size is at the upper limits of normal. The lungs appear clear. There is no pleural effusion or pneumothorax. No acute osseous findings are evident. Probable postsurgical changes in both shoulders. Mild thoracic spondylosis. IMPRESSION: No evidence of acute cardiopulmonary process. Borderline heart size. Electronically Signed   By: Richardean Sale M.D.   On: 02/27/2023 14:56   CT Renal Stone Study  Result Date: 02/27/2023 CLINICAL DATA:  Right flank pain for 3-4 days EXAM: CT ABDOMEN AND PELVIS WITHOUT CONTRAST CT LUMBAR SPINE WITHOUT CONTRAST TECHNIQUE: Multidetector CT imaging of the abdomen and pelvis was performed following the standard protocol without IV contrast. Multidetector CT imaging of the abdomen and pelvis was performed following the standard protocol without IV contrast. I will be back on in the little while later in the buccal down into some work first RADIATION DOSE REDUCTION: This exam was performed according to the departmental  dose-optimization program which includes automated exposure control, adjustment of the mA and/or kV according to patient size and/or use of iterative reconstruction technique. COMPARISON:  MR lumbar spine, 06/13/2017 FINDINGS: CT ABDOMEN PELVIS FINDINGS Lower chest: Moderate bilateral pleural effusions and associated atelectasis or consolidation. Interlobular septal thickening throughout the lung bases. Cardiomegaly. Hepatobiliary: No focal liver abnormality is seen. Status post cholecystectomy. Postoperative biliary ductal dilatation. Pancreas: Unremarkable. No pancreatic ductal dilatation or surrounding inflammatory changes. Spleen: Normal in size without significant abnormality. Adrenals/Urinary Tract: Adrenal glands are unremarkable. Kidneys are normal, without renal calculi, solid lesion, or hydronephrosis. Severe bladder wall thickening. Stomach/Bowel: Stomach is within normal limits. 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 Appendix appears normal. No evidence of bowel wall thickening, distention, or inflammatory changes. Vascular/Lymphatic: Aortic atherosclerosis. No enlarged  abdominal or pelvic lymph nodes. Reproductive: Severe prostatomegaly. Other: No abdominal wall hernia or abnormality. No ascites. Musculoskeletal: No acute or significant osseous findings. CT LUMBAR SPINE FINDINGS Segmentation: Five lumbar type vertebral bodies. Alignment: Normal lumbar lordosis. Vertebral bodies: No fracture or dislocation. No suspicious osseous lesions. Disc spaces: Moderate multilevel disc space height loss and osteophytosis throughout the lumbar spine. Moderate facet degenerative change, worst at the lower lumbar levels. IMPRESSION: 1. No acute noncontrast CT findings of the abdomen or pelvis to explain right flank pain. No urinary tract calculi or hydronephrosis. 2. Severe bladder wall thickening, possibly due to chronic outlet obstruction in the setting of severe prostatomegaly however may be  related to infectious or inflammatory cystitis. Correlate with urinalysis. 3. Cardiomegaly. Moderate bilateral pleural effusions and associated atelectasis or consolidation. Interlobular septal thickening throughout the lung bases, consistent with pulmonary edema. 4. No fracture or dislocation of the lumbar spine. Moderate multilevel lumbar disc and facet degenerative disease. Aortic Atherosclerosis (ICD10-I70.0). Electronically Signed   By: Delanna Ahmadi M.D.   On: 02/27/2023 14:31   CT L-SPINE NO CHARGE  Result Date: 02/27/2023 CLINICAL DATA:  Right flank pain for 3-4 days EXAM: CT ABDOMEN AND PELVIS WITHOUT CONTRAST CT LUMBAR SPINE WITHOUT CONTRAST TECHNIQUE: Multidetector CT imaging of the abdomen and pelvis was performed following the standard protocol without IV contrast. Multidetector CT imaging of the abdomen and pelvis was performed following the standard protocol without IV contrast. I will be back on in the little while later in the buccal down into some work first RADIATION DOSE REDUCTION: This exam was performed according to the departmental dose-optimization program which includes automated exposure control, adjustment of the mA and/or kV according to patient size and/or use of iterative reconstruction technique. COMPARISON:  MR lumbar spine, 06/13/2017 FINDINGS: CT ABDOMEN PELVIS FINDINGS Lower chest: Moderate bilateral pleural effusions and associated atelectasis or consolidation. Interlobular septal thickening throughout the lung bases. Cardiomegaly. Hepatobiliary: No focal liver abnormality is seen. Status post cholecystectomy. Postoperative biliary ductal dilatation. Pancreas: Unremarkable. No pancreatic ductal dilatation or surrounding inflammatory changes. Spleen: Normal in size without significant abnormality. Adrenals/Urinary Tract: Adrenal glands are unremarkable. Kidneys are normal, without renal calculi, solid lesion, or hydronephrosis. Severe bladder wall thickening. Stomach/Bowel:  Stomach is within normal limits. 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 Appendix appears normal. No evidence of bowel wall thickening, distention, or inflammatory changes. Vascular/Lymphatic: Aortic atherosclerosis. No enlarged abdominal or pelvic lymph nodes. Reproductive: Severe prostatomegaly. Other: No abdominal wall hernia or abnormality. No ascites. Musculoskeletal: No acute or significant osseous findings. CT LUMBAR SPINE FINDINGS Segmentation: Five lumbar type vertebral bodies. Alignment: Normal lumbar lordosis. Vertebral bodies: No fracture or dislocation. No suspicious osseous lesions. Disc spaces: Moderate multilevel disc space height loss and osteophytosis throughout the lumbar spine. Moderate facet degenerative change, worst at the lower lumbar levels. IMPRESSION: 1. No acute noncontrast CT findings of the abdomen or pelvis to explain right flank pain. No urinary tract calculi or hydronephrosis. 2. Severe bladder wall thickening, possibly due to chronic outlet obstruction in the setting of severe prostatomegaly however may be related to infectious or inflammatory cystitis. Correlate with urinalysis. 3. Cardiomegaly. Moderate bilateral pleural effusions and associated atelectasis or consolidation. Interlobular septal thickening throughout the lung bases, consistent with pulmonary edema. 4. No fracture or dislocation of the lumbar spine. Moderate multilevel lumbar disc and facet degenerative disease. Aortic Atherosclerosis (ICD10-I70.0). Electronically Signed   By: Delanna Ahmadi M.D.   On: 02/27/2023 14:31    Procedures Procedures  Medications Ordered in ED Medications  lidocaine (LIDODERM) 5 % 1 patch (1 patch Transdermal Patch Applied 02/27/23 1352)    ED Course/ Medical Decision Making/ A&P Clinical Course as of 02/27/23 2033  Sun Feb 27, 2023  1355 CBC with Differential No leukocytosis or anemia [HS]  1356 Comprehensive metabolic panel(!) No gross electrolyte  derangement or AKI.  ALT is slightly elevated at 59 and bili is mildly elevated at 1.5 [HS]  1356 Lipase, blood Wnl  [HS]  1419 Urinalysis, Routine w reflex microscopic -Urine, Clean Catch No hematuria or underlying UTI [HS]  1443 CT Renal Stone Study Severe prostatomegaly, no indications of UTI or cystitis based on the UA we collected.  Additionally no renal stone.  There is a note of cardiomegaly, bilateral pleural effusions and findings consistent with pulmonary edema.  I discussed this with the patient's wife, for the last 2 weeks he has been less active than previously, he does seem to be tiring out during routine activities.  They deny any shortness of breath, orthopnea, lower extremity swelling.  No history of heart failure.  Will check chest x-ray and BNP [HS]  1522 DG Chest Port 1 View Borderline cardiomegaly, no pulmonary edema or vascular congestion.  Agree with radiologist. [HS]    Clinical Course User Index [HS] Sherrill Raring, PA-C                             Medical Decision Making Amount and/or Complexity of Data Reviewed Labs: ordered. Decision-making details documented in ED Course. Radiology: ordered. Decision-making details documented in ED Course.  Risk Prescription drug management.   This is an 82 year old male with history of cholecystectomy, prior lumbar surgery, dementia, hypertension presenting to the emergency department due to right-sided flank pain for differential includes UTI, pyonephritis, pneumonia, nephrolithiasis, sepsis, myalgia.  History is provided by the patient's wife primarily as patient has dementia at baseline.  I also viewed external medical records.    I ordered, viewed and interpreted laboratory workup and imaging as documented in the ED course.  Clinically, patient did have signs of pulmonary edema on the CT renal study.    The chest x-ray is without edema or vascular congestion, he does have an elevated BNP at 601 which could be concerning  for new onset heart failure.  However, patient is mostly asymptomatic.  He is not hypoxic, there is no signs of volume overload on exam and there is no rales or lower extremity edema.  I will consult cardiology for their recommendations states that somebody can follow-up in the outpatient office versus needing admission.  I consulted with cardiology, Collene Mares Custovic.  Discussed elevated BNP, CT readings as well as physical exam.  Patient is not volume overloaded, hypoxic and there is no rales, he did have questionable finding of pulmonary edema on CT scan but no evidence of that on plain film.  We discussed cardiomegaly and possible new onset heart failure, they are actually able to see the patient in the office tomorrow or on Tuesday.  Given that I think it is appropriate for outpatient follow-up.  I updated the patient and the wife on the findings, workup.  No signs of nephrolithiasis and I think the pain on his right flank is the chronic back pain he has been dealing with for some number of years.  Cardiology will call him tomorrow to schedule follow-up in the clinic either tomorrow or Tuesday.  I discussed return precautions with  the patient and wife who verbalized understanding.  Patient was discharged in stable condition.        Final Clinical Impression(s) / ED Diagnoses Final diagnoses:  Flank pain  Cardiomegaly  Elevated brain natriuretic peptide (BNP) level    Rx / DC Orders ED Discharge Orders     None         Sherrill Raring, Hershal Coria 02/27/23 2033    Hayden Rasmussen, MD 02/28/23 1820

## 2023-02-27 NOTE — Discharge Instructions (Signed)
You are seen today in the emergency department for flank pain.  Reassuringly there is no evidence of kidney stone, you do have an enlarged prostate which should be followed up on with your urologist.  No need for antibiotics as there is no kidney infection either.  As we discussed your heart size was slightly enlarged and one of the markers of strain on the heart was elevated.  The cardiology team is going to call you tomorrow to schedule in person evaluation tomorrow or on Tuesday.  Please have your phone call and answered the phone call.  Return to the ED for any chest pain, shortness of breath, coughing up blood, new or concerning symptoms.

## 2023-02-27 NOTE — ED Provider Triage Note (Signed)
Emergency Medicine Provider Triage Evaluation Note  Gregg Summers , a 82 y.o. adult  was evaluated in triage.  Pt complains of right flank pain for the last few days.  Patient also had a fall.  Pain is mostly to the right flank, no dysuria.  He is color blind, unable to differentiate if he had any hematuria.  No fevers, no history of stones.  Denies any loss of bladder or bowel function  Review of Systems  Positive:  Negative:   Physical Exam  BP 128/85 (BP Location: Right Arm)   Pulse 69   Temp 97.6 F (36.4 C) (Oral)   Resp 16   Ht 5\' 9"  (1.753 m)   Wt 79.4 kg   SpO2 98%   BMI 25.84 kg/m  Gen:   Awake, no distress   Resp:  Normal effort  MSK:   Moves extremities without difficulty  Other:    Medical Decision Making  Medically screening exam initiated at 1:28 PM.  Appropriate orders placed.  Gregg Summers was informed that the remainder of the evaluation will be completed by another provider, this initial triage assessment does not replace that evaluation, and the importance of remaining in the ED until their evaluation is complete.     Sherrill Raring, PA-C 02/27/23 1328

## 2023-03-02 ENCOUNTER — Encounter: Payer: Self-pay | Admitting: Internal Medicine

## 2023-03-02 ENCOUNTER — Ambulatory Visit: Payer: Medicare PPO | Admitting: Internal Medicine

## 2023-03-02 VITALS — BP 143/92 | HR 68 | Ht 69.0 in | Wt 179.6 lb

## 2023-03-02 DIAGNOSIS — I10 Essential (primary) hypertension: Secondary | ICD-10-CM

## 2023-03-02 DIAGNOSIS — I517 Cardiomegaly: Secondary | ICD-10-CM

## 2023-03-02 DIAGNOSIS — E782 Mixed hyperlipidemia: Secondary | ICD-10-CM

## 2023-03-02 NOTE — Progress Notes (Signed)
Primary Physician/Referring:  [facility]  Patient ID: Gregg Summers, male    DOB: 25-Jan-1941, 82 y.o.   MRN: [id number]  Chief Complaint  Patient presents with  . Hypertension  . cardiomegaly  . Hospitalization Follow-up  . muscle aches   HPI:    Gregg Summers  is a 82 y.o. ***  Past Medical History:  Diagnosis Date  . Arthritis   . Dementia   . Hypertension   . Thyroid disease    Past Surgical History:  Procedure Laterality Date  . BACK SURGERY    . CHOLECYSTECTOMY    . JOINT REPLACEMENT    . ROTATOR CUFF REPAIR Bilateral    No family history on file.  Social History   Tobacco Use  . Smoking status: Never  . Smokeless tobacco: Never  Substance Use Topics  . Alcohol use: Not Currently   Marital Status: Unknown  ROS  ***ROS Objective  Blood pressure (!) 143/92, pulse 68, height 5\' 9"  (1.753 m), weight 179 lb 9.6 oz (81.5 kg), SpO2 96 %. Body mass index is 26.52 kg/m.     03/02/2023    9:47 AM 03/02/2023    9:39 AM 02/27/2023    4:44 PM  Vitals with BMI  Height  5\' 9"    Weight  179 lbs 10 oz   BMI  99991111   Systolic [id number]  Diastolic 92 88 70  Pulse 68 68 78     ***Physical Exam  Medications and allergies   Allergies  Allergen Reactions  . Septra [Sulfamethoxazole-Trimethoprim] Other (See Comments)    Dizziness.  . Aspirin Other (See Comments)    Dizziness.  VERY DIZZY  Other reaction(s): Other (See Comments)  Dizziness.  Dizziness.  . Ciprofloxacin Nausea Only     Medication list after today's encounter   Current Outpatient Medications:  .  allopurinol (ZYLOPRIM) 100 MG tablet, Take 200 mg by mouth daily., Disp: , Rfl:  .  donepezil (ARICEPT) 10 MG tablet, Take 10 mg by mouth daily., Disp: , Rfl:  .  Glucosamine-Chondroitin (OSTEO BI-FLEX REGULAR STRENGTH PO), Take 2 capsules by mouth daily., Disp: , Rfl:  .  levothyroxine (SYNTHROID, LEVOTHROID) 75 MCG tablet, Take 75 mcg by mouth daily before breakfast.,  Disp: , Rfl:  .  lisinopril (PRINIVIL,ZESTRIL) 10 MG tablet, Take 10 mg by mouth daily., Disp: , Rfl:  .  memantine (NAMENDA) 10 MG tablet, Take 10 mg by mouth 2 (two) times daily., Disp: , Rfl:  .  Multiple Vitamin (MULTI-VITAMIN) tablet, Take 2 tablets by mouth daily., Disp: , Rfl:  .  pravastatin (PRAVACHOL) 40 MG tablet, Take 40 mg by mouth daily., Disp: , Rfl:   Laboratory examination:   Lab Results  Component Value Date   NA 138 02/27/2023   K 4.0 02/27/2023   CO2 25 02/27/2023   GLUCOSE 101 (H) 02/27/2023   BUN 16 02/27/2023   CREATININE 0.78 02/27/2023   CALCIUM 8.7 (L) 02/27/2023   GFRNONAA >60 02/27/2023       Latest Ref Rng & Units 02/27/2023    1:21 PM 01/09/2023   10:15 AM  CMP  Glucose 70 - 99 mg/dL 101  143   BUN 8 - 23 mg/dL 16  15   Creatinine 0.61 - 1.24 mg/dL 0.78  0.90   Sodium 135 - 145 mmol/L 138  138   Potassium 3.5 - 5.1 mmol/L 4.0  4.3   Chloride 98 - 111 mmol/L 105  105  CO2 22 - 32 mmol/L 25  25   Calcium 8.9 - 10.3 mg/dL 8.7  8.9   Total Protein 6.5 - 8.1 g/dL 6.8    Total Bilirubin 0.3 - 1.2 mg/dL 1.5    Alkaline Phos 38 - 126 U/L 78    AST 15 - 41 U/L 30    ALT 0 - 44 U/L 59        Latest Ref Rng & Units 02/27/2023    1:21 PM 01/09/2023   10:15 AM  CBC  WBC 4.0 - 10.5 K/uL 6.5  6.7   Hemoglobin 13.0 - 17.0 g/dL 15.1  14.8   Hematocrit 39.0 - 52.0 % 47.5  45.8   Platelets 150 - 400 K/uL 174  196     Lipid Panel No results for input(s): "CHOL", "TRIG", "[place]", "VLDL", "HDL", "CHOLHDL", "LDLDIRECT" in the last 8760 hours.  HEMOGLOBIN A1C No results found for: "HGBA1C", "MPG" TSH No results for input(s): "TSH" in the last 8760 hours.  External labs:   ***  Radiology:    Cardiac Studies:   No results found for this or any previous visit from the past 1095 days.     No results found for this or any previous visit from the past 1095 days.   ***  EKG:   ***  ***  Assessment  No diagnosis found.   No orders of the  defined types were placed in this encounter.   No orders of the defined types were placed in this encounter.   There are no discontinued medications.   Recommendations:   Gregg Summers is a 82 y.o.  ***    Floydene Flock, DO, [facility]  03/02/2023, 9:57 AM Office: [phone] Pager: [phone]

## 2023-03-29 ENCOUNTER — Ambulatory Visit: Payer: Medicare PPO

## 2023-03-29 DIAGNOSIS — I517 Cardiomegaly: Secondary | ICD-10-CM

## 2023-04-13 ENCOUNTER — Encounter: Payer: Self-pay | Admitting: Internal Medicine

## 2023-04-13 ENCOUNTER — Other Ambulatory Visit: Payer: Self-pay

## 2023-04-13 ENCOUNTER — Telehealth: Payer: Self-pay

## 2023-04-13 ENCOUNTER — Ambulatory Visit: Payer: Medicare PPO | Admitting: Internal Medicine

## 2023-04-13 VITALS — BP 132/70 | HR 59 | Ht 69.0 in | Wt 168.2 lb

## 2023-04-13 DIAGNOSIS — E782 Mixed hyperlipidemia: Secondary | ICD-10-CM

## 2023-04-13 DIAGNOSIS — I5020 Unspecified systolic (congestive) heart failure: Secondary | ICD-10-CM

## 2023-04-13 DIAGNOSIS — I10 Essential (primary) hypertension: Secondary | ICD-10-CM

## 2023-04-13 MED ORDER — EMPAGLIFLOZIN 10 MG PO TABS: 10.0000 mg | ORAL_TABLET | Freq: Every day | ORAL | 10 refills | Status: DC

## 2023-04-13 MED ORDER — METOPROLOL SUCCINATE ER 25 MG PO TB24: 25.0000 mg | ORAL_TABLET | Freq: Every day | ORAL | 3 refills | Status: DC

## 2023-04-13 MED ORDER — CLOPIDOGREL BISULFATE 75 MG PO TABS: 75.0000 mg | ORAL_TABLET | Freq: Every day | ORAL | 1 refills | Status: DC

## 2023-04-13 MED ORDER — ASPIRIN 81 MG PO TBEC: 81.0000 mg | DELAYED_RELEASE_TABLET | Freq: Every day | ORAL | 12 refills | Status: DC

## 2023-04-13 NOTE — Telephone Encounter (Signed)
I sent in plavix 75 mg and he doesn't need to take aspirin per [place]. Lvm for patients wife to call back

## 2023-04-13 NOTE — Telephone Encounter (Signed)
75 mg

## 2023-04-13 NOTE — Progress Notes (Signed)
Primary Physician/Referring:  Louis Matte, MD  Patient ID: Gregg Summers, male    DOB: [birth year], 82 y.o.   MRN: [id number]  Chief Complaint  Patient presents with   cardiomegaly   Follow-up   Results   HPI:    Gregg Summers  is a 82 y.o. male with past medical history significant for hypertension, cardiomegaly, and chronic back pain who is here for a follow-up visit. He is accompanied by his wife today who admits patient has really slowed down with his activity lately. He has no energy and his back pain is also associated with chest pain. He used to be very active and walk with his wife all the time but now he is unable to do that. His wife states that he has been sleeping much more and he is exhausted with minimal activity. They are agreeable to proceed with heart catheterization given this new change in the last few months in addition to newly reduced LVEF. Patient denies shortness of breath, palpitations, diaphoresis, syncope, edema, PND, orthopnea.   Past Medical History:  Diagnosis Date   Arthritis    Dementia (HCC)    Hypertension    Thyroid disease    Past Surgical History:  Procedure Laterality Date   BACK SURGERY     CHOLECYSTECTOMY     JOINT REPLACEMENT     ROTATOR CUFF REPAIR Bilateral    No family history on file.  Social History   Tobacco Use   Smoking status: Never   Smokeless tobacco: Never  Substance Use Topics   Alcohol use: Not Currently   Marital Status: Unknown  ROS  Review of Systems  Constitutional: Positive for malaise/fatigue.  Cardiovascular:  Positive for chest pain. Negative for dyspnea on exertion, irregular heartbeat, leg swelling, orthopnea, palpitations and syncope.  Musculoskeletal:  Positive for arthritis and back pain.  Neurological:  Positive for weakness.   Objective  Blood pressure 132/70, pulse (!) 59, height 5\' 9"  (1.753 m), weight 168 lb 3.2 oz (76.3 kg), SpO2 99 %. Body mass index is 24.84 kg/m.      04/13/2023   11:26 AM 03/02/2023    9:47 AM 03/02/2023    9:39 AM  Vitals with BMI  Height 5\' 9"   5\' 9"   Weight 168 lbs 3 oz  179 lbs 10 oz  BMI 24.83  26.51  Systolic 132 143 782  Diastolic 70 92 88  Pulse 59 68 68     Physical Exam Vitals reviewed.  HENT:     Head: Normocephalic and atraumatic.  Neck:     Vascular: No carotid bruit.  Cardiovascular:     Rate and Rhythm: Normal rate and regular rhythm.     Heart sounds: Normal heart sounds. No murmur heard. Pulmonary:     Effort: Pulmonary effort is normal.     Breath sounds: Normal breath sounds.  Abdominal:     General: Bowel sounds are normal.  Musculoskeletal:     Right lower leg: No edema.     Left lower leg: No edema.  Skin:    General: Skin is warm and dry.  Neurological:     Mental Status: He is alert.     Medications and allergies   Allergies  Allergen Reactions   Septra [Sulfamethoxazole-Trimethoprim] Other (See Comments)    Dizziness.   Aspirin Other (See Comments)    Dizziness.  VERY DIZZY  Other reaction(s): Other (See Comments)  Dizziness.  Dizziness.   Ciprofloxacin Nausea Only  Medication list after today's encounter   Current Outpatient Medications:    allopurinol (ZYLOPRIM) 100 MG tablet, Take 200 mg by mouth daily., Disp: , Rfl:    aspirin EC 81 MG tablet, Take 1 tablet (81 mg total) by mouth daily. Swallow whole., Disp: 30 tablet, Rfl: 12   donepezil (ARICEPT) 10 MG tablet, Take 10 mg by mouth daily., Disp: , Rfl:    empagliflozin (JARDIANCE) 10 MG TABS tablet, Take 1 tablet (10 mg total) by mouth daily before breakfast., Disp: 30 tablet, Rfl: 10   Glucosamine-Chondroitin (OSTEO BI-FLEX REGULAR STRENGTH PO), Take 2 capsules by mouth daily., Disp: , Rfl:    levothyroxine (SYNTHROID, LEVOTHROID) 75 MCG tablet, Take 75 mcg by mouth daily before breakfast., Disp: , Rfl:    lisinopril (PRINIVIL,ZESTRIL) 10 MG tablet, Take 10 mg by mouth daily., Disp: , Rfl:    memantine (NAMENDA) 10 MG  tablet, Take 10 mg by mouth 2 (two) times daily., Disp: , Rfl:    metoprolol succinate (TOPROL XL) 25 MG 24 hr tablet, Take 1 tablet (25 mg total) by mouth daily., Disp: 90 tablet, Rfl: 3   Multiple Vitamin (MULTI-VITAMIN) tablet, Take 2 tablets by mouth daily., Disp: , Rfl:    naproxen sodium (ALEVE) 220 MG tablet, Take 220 mg by mouth as needed., Disp: , Rfl:    pravastatin (PRAVACHOL) 40 MG tablet, Take 40 mg by mouth daily., Disp: , Rfl:   Laboratory examination:   Lab Results  Component Value Date   NA 138 02/27/2023   K 4.0 02/27/2023   CO2 25 02/27/2023   GLUCOSE 101 (H) 02/27/2023   BUN 16 02/27/2023   CREATININE 0.78 02/27/2023   CALCIUM 8.7 (L) 02/27/2023   GFRNONAA >60 02/27/2023       Latest Ref Rng & Units 02/27/2023    1:21 PM 01/09/2023   10:15 AM  CMP  Glucose 70 - 99 mg/dL 161  096   BUN 8 - 23 mg/dL 16  15   Creatinine 0.45 - 1.24 mg/dL 4.09  8.11   Sodium 914 - 145 mmol/L 138  138   Potassium 3.5 - 5.1 mmol/L 4.0  4.3   Chloride 98 - 111 mmol/L 105  105   CO2 22 - 32 mmol/L 25  25   Calcium 8.9 - 10.3 mg/dL 8.7  8.9   Total Protein 6.5 - 8.1 g/dL 6.8    Total Bilirubin 0.3 - 1.2 mg/dL 1.5    Alkaline Phos 38 - 126 U/L 78    AST 15 - 41 U/L 30    ALT 0 - 44 U/L 59        Latest Ref Rng & Units 02/27/2023    1:21 PM 01/09/2023   10:15 AM  CBC  WBC 4.0 - 10.5 K/uL 6.5  6.7   Hemoglobin 13.0 - 17.0 g/dL 78.2  95.6   Hematocrit 39.0 - 52.0 % 47.5  45.8   Platelets 150 - 400 K/uL 174  196     Lipid Panel No results for input(s): "CHOL", "TRIG", "LDLCALC", "VLDL", "HDL", "CHOLHDL", "LDLDIRECT" in the last 8760 hours.  HEMOGLOBIN A1C No results found for: "HGBA1C", "MPG" TSH No results for input(s): "TSH" in the last 8760 hours.  External labs:    Glucose, Bld 101 (*)       Calcium 8.7 (*)      ALT 59 (*)      Total Bilirubin 1.5 (*)      All other components within normal  limits  BRAIN NATRIURETIC PEPTIDE - Abnormal; Notable for the following  components:    B Natriuretic Peptide 601.0     Radiology:    Cardiac Studies:   Echocardiogram 03/29/2023: Moderately depressed LV systolic function with visual EF 30-35%. Left ventricle cavity is normal in size. Severe concentric hypertrophy of the left ventricle. Hypokinetic global wall motion. Indeterminate diastolic filling pattern. Calculated EF 30%. Left atrial cavity is mildly dilated at 35.2 ml/m^2. Structurally normal mitral valve.  Mild (Grade I) mitral regurgitation. Structurally normal tricuspid valve with trace regurgitation. No evidence of pulmonary hypertension. No prior available for comparison.  EKG:   02/27/2023: Sinus rhythm with PACs. Right bundle branch block, rate 65 bpm. No ischemia  Assessment     ICD-10-CM   1. Essential hypertension  I10 CBC    Basic metabolic panel    2. Mixed hyperlipidemia  E78.2 CBC    Basic metabolic panel    3. 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    Dispense:  90 tablet    Refill:  3   empagliflozin (JARDIANCE) 10 MG TABS tablet    Sig: Take 1 tablet (10 mg total) by mouth daily before breakfast.    Dispense:  30 tablet    Refill:  10   aspirin EC 81 MG tablet    Sig: Take 1 tablet (81 mg total) by mouth daily. Swallow whole.    Dispense:  30 tablet    Refill:  12    There are no discontinued medications.   Recommendations:   ZACKARIE MASSARI is a 82 y.o.  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 I will add Jardiance and  Toprol XL. He can tolerate ASA 81 mg so I will send him a script for that as well Pre-cath labs have been ordered Clinically, patient is not in heart failure Patient instructed not to do heavy lifting, heavy exertional activity, swimming until evaluation is complete.  Patient instructed to call if symptoms worse or to go to the ED for further evaluation. Schedule for cardiac catheterization, and possible angioplasty. We discussed regarding risks, benefits, alternatives to this including stress testing, CTA and continued medical therapy. Patient wants to proceed. Understands <1-2% risk of death, stroke, MI, urgent CABG, bleeding, infection, renal failure but not limited to these.   Essential hypertension Continue current cardiac medications. BP is controlled at this time Encourage low-sodium diet, less than 2000 mg daily. Follow-up in 1 months or sooner if needed.   Mixed hyperlipidemia Continue pravastatin PCP following lipids     Clotilde Dieter, DO, [facility]  04/13/2023, 11:46 AM Office: [phone] Pager: [phone]

## 2023-04-13 NOTE — Telephone Encounter (Signed)
The patient does not remember exactly what it is

## 2023-04-13 NOTE — Telephone Encounter (Signed)
Patients wife called and said he is allergic to aspirin, but does not know what happens to him when he takes it because he cannot remember but she knows that he cannot take aspirin

## 2023-04-20 ENCOUNTER — Telehealth: Payer: Self-pay

## 2023-04-20 LAB — BASIC METABOLIC PANEL
BUN/Creatinine Ratio: 19 (ref 10–24)
BUN: 21 mg/dL (ref 8–27)
CO2: 24 mmol/L (ref 20–29)
Calcium: 9.9 mg/dL (ref 8.6–10.2)
Chloride: 105 mmol/L (ref 96–106)
Creatinine, Ser: 1.08 mg/dL (ref 0.76–1.27)
Glucose: 75 mg/dL (ref 70–99)
Potassium: 4.6 mmol/L (ref 3.5–5.2)
Sodium: 144 mmol/L (ref 134–144)
eGFR: 68 mL/min/{1.73_m2} (ref >59)

## 2023-04-20 LAB — CBC
Hematocrit: 50.3 % (ref 37.5–51.0)
Hemoglobin: 16.3 g/dL (ref 13.0–17.7)
MCH: 30.8 pg (ref 26.6–33.0)
MCHC: 32.4 g/dL (ref 31.5–35.7)
MCV: 95 fL (ref 79–97)
Platelets: 227 10*3/uL (ref 150–450)
RBC: 5.29 x10E6/uL (ref 4.14–5.80)
RDW: 13 % (ref 11.6–15.4)
WBC: 6.2 10*3/uL (ref 3.4–10.8)

## 2023-04-20 NOTE — Telephone Encounter (Signed)
Patient wife wanted Jardiance called into [facility] She had requested that it be sent to local pharmacy on day of her husband ov. I cancelled [facility] and had it sent to local pharmacy. Today patient wife called to ask why Jardiance wasn't sent into [facility]order. I explained to her she had previously called to have rx transferred to local pharmacy. She doesn't recall doing this. I confirmed with patient she wants me to cancel local pharmacy and resend into [facility]. I called and gave verbal order. Called patient back and confirmed with her . She verbalized understanding.

## 2023-04-26 ENCOUNTER — Ambulatory Visit (HOSPITAL_COMMUNITY)
Admission: RE | Admit: 2023-04-26 | Discharge: 2023-04-26 | Disposition: A | Discharge status: Home / Self Care | Payer: Medicare PPO | Attending: Cardiology | Admitting: Cardiology

## 2023-04-26 ENCOUNTER — Ambulatory Visit (HOSPITAL_COMMUNITY)
Admission: RE | Disposition: A | Discharge status: Home / Self Care | Payer: Self-pay | Source: Home / Self Care | Attending: Cardiology

## 2023-04-26 DIAGNOSIS — I110 Hypertensive heart disease with heart failure: Secondary | ICD-10-CM | POA: Insufficient documentation

## 2023-04-26 DIAGNOSIS — I428 Other cardiomyopathies: Secondary | ICD-10-CM | POA: Diagnosis not present

## 2023-04-26 DIAGNOSIS — E782 Mixed hyperlipidemia: Secondary | ICD-10-CM | POA: Diagnosis not present

## 2023-04-26 DIAGNOSIS — I5020 Unspecified systolic (congestive) heart failure: Secondary | ICD-10-CM | POA: Diagnosis not present

## 2023-04-26 HISTORY — PX: LEFT HEART CATH AND CORONARY ANGIOGRAPHY: CATH118249

## 2023-04-26 SURGERY — LEFT HEART CATH AND CORONARY ANGIOGRAPHY
Anesthesia: LOCAL

## 2023-04-26 MED ORDER — LIDOCAINE HCL (PF) 1 % IJ SOLN
INTRAMUSCULAR | Status: DC | PRN
  Administered 2023-04-26: 2 mL

## 2023-04-26 MED ORDER — SODIUM CHLORIDE 0.9 % IV SOLN: INTRAVENOUS | Status: DC

## 2023-04-26 MED ORDER — MIDAZOLAM HCL 2 MG/2ML IJ SOLN
INTRAMUSCULAR | Status: AC
  Filled 2023-04-26: qty 2

## 2023-04-26 MED ORDER — LABETALOL HCL 5 MG/ML IV SOLN: 10.0000 mg | INTRAVENOUS | Status: DC | PRN

## 2023-04-26 MED ORDER — ACETAMINOPHEN 325 MG PO TABS: 650.0000 mg | ORAL_TABLET | ORAL | Status: DC | PRN

## 2023-04-26 MED ORDER — HEPARIN (PORCINE) IN NACL 1000-0.9 UT/500ML-% IV SOLN
INTRAVENOUS | Status: DC | PRN
  Administered 2023-04-26 (×2): 500 mL via INTRA_ARTERIAL

## 2023-04-26 MED ORDER — SODIUM CHLORIDE 0.9 % IV SOLN: 250.0000 mL | INTRAVENOUS | Status: DC | PRN

## 2023-04-26 MED ORDER — LIDOCAINE HCL (PF) 1 % IJ SOLN
INTRAMUSCULAR | Status: AC
  Filled 2023-04-26: qty 30

## 2023-04-26 MED ORDER — SODIUM CHLORIDE 0.9% FLUSH: 3.0000 mL | Freq: Two times a day (BID) | INTRAVENOUS | Status: DC

## 2023-04-26 MED ORDER — VERAPAMIL HCL 2.5 MG/ML IV SOLN
INTRAVENOUS | Status: AC
  Filled 2023-04-26: qty 2

## 2023-04-26 MED ORDER — VERAPAMIL HCL 2.5 MG/ML IV SOLN
INTRAVENOUS | Status: DC | PRN
  Administered 2023-04-26: 10 mL via INTRA_ARTERIAL

## 2023-04-26 MED ORDER — SODIUM CHLORIDE 0.9% FLUSH: 3.0000 mL | INTRAVENOUS | Status: DC | PRN

## 2023-04-26 MED ORDER — IOHEXOL 350 MG/ML SOLN
INTRAVENOUS | Status: DC | PRN
  Administered 2023-04-26: 35 mL via INTRA_ARTERIAL

## 2023-04-26 MED ORDER — ONDANSETRON HCL 4 MG/2ML IJ SOLN: 4.0000 mg | Freq: Four times a day (QID) | INTRAMUSCULAR | Status: DC | PRN

## 2023-04-26 MED ORDER — ASPIRIN 81 MG PO CHEW: 81.0000 mg | CHEWABLE_TABLET | ORAL | Status: DC

## 2023-04-26 MED ORDER — SODIUM CHLORIDE 0.9 % IV SOLN: INTRAVENOUS | Status: AC

## 2023-04-26 MED ORDER — HYDRALAZINE HCL 20 MG/ML IJ SOLN: 10.0000 mg | INTRAMUSCULAR | Status: DC | PRN

## 2023-04-26 MED ORDER — HEPARIN SODIUM (PORCINE) 1000 UNIT/ML IJ SOLN
INTRAMUSCULAR | Status: DC | PRN
  Administered 2023-04-26: 4000 [IU] via INTRAVENOUS

## 2023-04-26 MED ORDER — FENTANYL CITRATE (PF) 100 MCG/2ML IJ SOLN
INTRAMUSCULAR | Status: AC
  Filled 2023-04-26: qty 2

## 2023-04-26 MED ORDER — HEPARIN SODIUM (PORCINE) 1000 UNIT/ML IJ SOLN
INTRAMUSCULAR | Status: AC
  Filled 2023-04-26: qty 10

## 2023-04-26 SURGICAL SUPPLY — 13 items
BAND CMPR LRG ZPHR (HEMOSTASIS) ×1
BAND ZEPHYR COMPRESS 30 LONG (HEMOSTASIS) IMPLANT
CATH INFINITI 5 FR AR2 MOD (CATHETERS) IMPLANT
CATH INFINITI JR4 5F (CATHETERS) IMPLANT
CATH OPTITORQUE TIG 4.0 5F (CATHETERS) IMPLANT
COVER SWIFTLINK CONNECTOR (BAG) IMPLANT
GLIDESHEATH SLEND A-KIT 6F 22G (SHEATH) IMPLANT
GUIDEWIRE INQWIRE 1.5J.035X260 (WIRE) IMPLANT
INQWIRE 1.5J .035X260CM (WIRE) ×1
KIT HEART LEFT (KITS) ×1 IMPLANT
PACK CARDIAC CATHETERIZATION (CUSTOM PROCEDURE TRAY) ×1 IMPLANT
TRANSDUCER W/STOPCOCK (MISCELLANEOUS) ×1 IMPLANT
TUBING CIL FLEX 10 FLL-RA (TUBING) ×1 IMPLANT

## 2023-04-26 NOTE — Interval H&P Note (Signed)
History and Physical Interval Note:  04/26/2023 7:25 AM  Gregg Summers  has presented today for surgery, with the diagnosis of cad.  The various methods of treatment have been discussed with the patient and family. After consideration of risks, benefits and other options for treatment, the patient has consented to  Procedure(s): LEFT HEART CATH AND CORONARY ANGIOGRAPHY (N/A) as a surgical intervention.  The patient's history has been reviewed, patient examined, no change in status, stable for surgery.  I have reviewed the patient's chart and labs.  Questions were answered to the patient's satisfaction.    2012 Appropriate Use Criteria for Diagnostic Catheterization Cardiomyopathies (Right and Left Heart Catheterization OR Right Heart Catheterization Alone With/Without Left Ventriculography and Coronary Angiography) Indication:  Known or suspected cardiomyopathy with or without heart failure A (7) Indication: 93; Score 7   Gregg Summers J Lanise Mergen

## 2023-04-26 NOTE — Progress Notes (Signed)
TR BAND REMOVAL  LOCATION:    right radial  DEFLATED PER PROTOCOL:    Yes.    TIME BAND OFF / DRESSING APPLIED:    1000 gauze dressing applied    SITE UPON ARRIVAL:    Level 0  SITE AFTER BAND REMOVAL:    Level 0  CIRCULATION SENSATION AND MOVEMENT:    Within Normal Limits   Yes.    COMMENTS:   no issues noted

## 2023-04-26 NOTE — Discharge Instructions (Signed)

## 2023-04-26 NOTE — H&P (Signed)
OV 04/13/2023 copied for doecumentation    Primary Physician/Referring:  Entzminger, Danton Clap, MD  Patient ID: Gregg Summers, male    DOB: 31-Aug-1941, 82 y.o.   MRN: [id number]  Chief Complaint  Patient presents with   cardiomegaly   Follow-up   Results   HPI:    MONSERRAT Summers  is a 82 y.o. male with past medical history significant for hypertension, cardiomegaly, and chronic back pain who is here for a follow-up visit. He is accompanied by his wife today who admits patient has really slowed down with his activity lately. He has no energy and his back pain is also associated with chest pain. He used to be very active and walk with his wife all the time but now he is unable to do that. His wife states that he has been sleeping much more and he is exhausted with minimal activity. They are agreeable to proceed with heart catheterization given this new change in the last few months in addition to newly reduced LVEF. Patient denies shortness of breath, palpitations, diaphoresis, syncope, edema, PND, orthopnea.   Past Medical History:  Diagnosis Date   Arthritis    Dementia (HCC)    Hypertension    Thyroid disease    Past Surgical History:  Procedure Laterality Date   BACK SURGERY     CHOLECYSTECTOMY     JOINT REPLACEMENT     ROTATOR CUFF REPAIR Bilateral    No family history on file.  Social History   Tobacco Use   Smoking status: Never   Smokeless tobacco: Never  Substance Use Topics   Alcohol use: Not Currently   Marital Status: Unknown  ROS  Review of Systems  Constitutional: Positive for malaise/fatigue.  Cardiovascular:  Positive for chest pain. Negative for dyspnea on exertion, irregular heartbeat, leg swelling, orthopnea, palpitations and syncope.  Musculoskeletal:  Positive for arthritis and back pain.  Neurological:  Positive for weakness.   Objective  Blood pressure 132/70, pulse (!) 59, height 5\' 9"  (1.753 m), weight 168 lb 3.2 oz (76.3 kg), SpO2 99 %.  Body mass index is 24.84 kg/m.     04/13/2023   11:26 AM 03/02/2023    9:47 AM 03/02/2023    9:39 AM  Vitals with BMI  Height 5\' 9"   5\' 9"   Weight 168 lbs 3 oz  179 lbs 10 oz  BMI 24.83  26.51  Systolic 132 143 664  Diastolic 70 92 88  Pulse 59 68 68     Physical Exam Vitals reviewed.  HENT:     Head: Normocephalic and atraumatic.  Neck:     Vascular: No carotid bruit.  Cardiovascular:     Rate and Rhythm: Normal rate and regular rhythm.     Heart sounds: Normal heart sounds. No murmur heard. Pulmonary:     Effort: Pulmonary effort is normal.     Breath sounds: Normal breath sounds.  Abdominal:     General: Bowel sounds are normal.  Musculoskeletal:     Right lower leg: No edema.     Left lower leg: No edema.  Skin:    General: Skin is warm and dry.  Neurological:     Mental Status: He is alert.     Medications and allergies   Allergies  Allergen Reactions   Septra [Sulfamethoxazole-Trimethoprim] Other (See Comments)    Dizziness.   Aspirin Other (See Comments)    Dizziness.  VERY DIZZY  Other reaction(s): Other (See Comments)  Dizziness.  Dizziness.  Ciprofloxacin Nausea Only     Medication list after today's encounter   Current Outpatient Medications:    allopurinol (ZYLOPRIM) 100 MG tablet, Take 200 mg by mouth daily., Disp: , Rfl:    aspirin EC 81 MG tablet, Take 1 tablet (81 mg total) by mouth daily. Swallow whole., Disp: 30 tablet, Rfl: 12   donepezil (ARICEPT) 10 MG tablet, Take 10 mg by mouth daily., Disp: , Rfl:    empagliflozin (JARDIANCE) 10 MG TABS tablet, Take 1 tablet (10 mg total) by mouth daily before breakfast., Disp: 30 tablet, Rfl: 10   Glucosamine-Chondroitin (OSTEO BI-FLEX REGULAR STRENGTH PO), Take 2 capsules by mouth daily., Disp: , Rfl:    levothyroxine (SYNTHROID, LEVOTHROID) 75 MCG tablet, Take 75 mcg by mouth daily before breakfast., Disp: , Rfl:    lisinopril (PRINIVIL,ZESTRIL) 10 MG tablet, Take 10 mg by mouth daily., Disp: ,  Rfl:    memantine (NAMENDA) 10 MG tablet, Take 10 mg by mouth 2 (two) times daily., Disp: , Rfl:    metoprolol succinate (TOPROL XL) 25 MG 24 hr tablet, Take 1 tablet (25 mg total) by mouth daily., Disp: 90 tablet, Rfl: 3   Multiple Vitamin (MULTI-VITAMIN) tablet, Take 2 tablets by mouth daily., Disp: , Rfl:    naproxen sodium (ALEVE) 220 MG tablet, Take 220 mg by mouth as needed., Disp: , Rfl:    pravastatin (PRAVACHOL) 40 MG tablet, Take 40 mg by mouth daily., Disp: , Rfl:   Laboratory examination:   Lab Results  Component Value Date   NA 138 02/27/2023   K 4.0 02/27/2023   CO2 25 02/27/2023   GLUCOSE 101 (H) 02/27/2023   BUN 16 02/27/2023   CREATININE 0.78 02/27/2023   CALCIUM 8.7 (L) 02/27/2023   GFRNONAA >60 02/27/2023       Latest Ref Rng & Units 02/27/2023    1:21 PM 01/09/2023   10:15 AM  CMP  Glucose 70 - 99 mg/dL 161  096   BUN 8 - 23 mg/dL 16  15   Creatinine 0.45 - 1.24 mg/dL 4.09  8.11   Sodium 914 - 145 mmol/L 138  138   Potassium 3.5 - 5.1 mmol/L 4.0  4.3   Chloride 98 - 111 mmol/L 105  105   CO2 22 - 32 mmol/L 25  25   Calcium 8.9 - 10.3 mg/dL 8.7  8.9   Total Protein 6.5 - 8.1 g/dL 6.8    Total Bilirubin 0.3 - 1.2 mg/dL 1.5    Alkaline Phos 38 - 126 U/L 78    AST 15 - 41 U/L 30    ALT 0 - 44 U/L 59        Latest Ref Rng & Units 02/27/2023    1:21 PM 01/09/2023   10:15 AM  CBC  WBC 4.0 - 10.5 K/uL 6.5  6.7   Hemoglobin 13.0 - 17.0 g/dL 78.2  95.6   Hematocrit 39.0 - 52.0 % 47.5  45.8   Platelets 150 - 400 K/uL 174  196     Lipid Panel No results for input(s): "CHOL", "TRIG", "LDLCALC", "VLDL", "HDL", "CHOLHDL", "LDLDIRECT" in the last 8760 hours.  HEMOGLOBIN A1C No results found for: "HGBA1C", "MPG" TSH No results for input(s): "TSH" in the last 8760 hours.  External labs:    Glucose, Bld 101 (*)       Calcium 8.7 (*)      ALT 59 (*)      Total Bilirubin 1.5 (*)  All other components within normal limits  BRAIN NATRIURETIC PEPTIDE -  Abnormal; Notable for the following components:    B Natriuretic Peptide 601.0     Radiology:    Cardiac Studies:   Echocardiogram 03/29/2023: Moderately depressed LV systolic function with visual EF 30-35%. Left ventricle cavity is normal in size. Severe concentric hypertrophy of the left ventricle. Hypokinetic global wall motion. Indeterminate diastolic filling pattern. Calculated EF 30%. Left atrial cavity is mildly dilated at 35.2 ml/m^2. Structurally normal mitral valve.  Mild (Grade I) mitral regurgitation. Structurally normal tricuspid valve with trace regurgitation. No evidence of pulmonary hypertension. No prior available for comparison.  EKG:   02/27/2023: Sinus rhythm with PACs. Right bundle branch block, rate 65 bpm. No ischemia  Assessment     ICD-10-CM   1. Essential hypertension  I10 CBC    Basic metabolic panel    2. Mixed hyperlipidemia  E78.2 CBC    Basic metabolic panel    3. 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    Dispense:  90 tablet    Refill:  3   empagliflozin (JARDIANCE) 10 MG TABS tablet    Sig: Take 1 tablet (10 mg total) by mouth daily before breakfast.    Dispense:  30 tablet    Refill:  10   aspirin EC 81 MG tablet    Sig: Take 1 tablet (81 mg total) by mouth daily. Swallow whole.    Dispense:  30 tablet    Refill:  12    There are no discontinued medications.   Recommendations:   DVONTE STERKEL is a 82 y.o.  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 I will add Jardiance and Toprol XL. He can tolerate ASA 81 mg so I will send him a script for that as well Pre-cath labs have been ordered Clinically, patient is not in heart failure Patient instructed not to do heavy lifting, heavy exertional activity, swimming until evaluation is complete.  Patient instructed to call if symptoms worse or to go to the ED for further evaluation. Schedule for cardiac catheterization, and possible angioplasty. We discussed regarding risks, benefits, alternatives to this including stress testing, CTA and continued medical therapy. Patient wants to proceed. Understands <1-2% risk of death, stroke, MI, urgent CABG, bleeding, infection, renal failure but not limited to these.   Essential hypertension Continue current cardiac medications. BP is controlled at this time Encourage low-sodium diet, less than 2000 mg daily. Follow-up in 1 months or sooner if needed.   Mixed hyperlipidemia Continue pravastatin PCP following lipids     Clotilde Dieter, DO, [facility]  04/13/2023, 11:46 AM Office: [phone] Pager: [phone]

## 2023-04-27 ENCOUNTER — Encounter (HOSPITAL_COMMUNITY): Payer: Self-pay | Admitting: Cardiology

## 2023-04-27 MED FILL — Midazolam HCl Inj 2 MG/2ML (Base Equivalent): INTRAMUSCULAR | Qty: 2 | Status: AC

## 2023-04-27 MED FILL — Fentanyl Citrate Preservative Free (PF) Inj 100 MCG/2ML: INTRAMUSCULAR | Qty: 2 | Status: AC

## 2023-09-27 LAB — LAB REPORT - SCANNED: EGFR: 64

## 2023-12-12 ENCOUNTER — Ambulatory Visit: Payer: Medicare HMO | Attending: Cardiology | Admitting: Cardiology

## 2024-03-05 ENCOUNTER — Encounter: Payer: Self-pay | Admitting: Cardiology

## 2024-03-05 ENCOUNTER — Ambulatory Visit: Payer: Medicare HMO | Attending: Cardiology | Admitting: Cardiology

## 2024-03-05 VITALS — BP 120/72 | HR 62 | Ht 67.0 in | Wt 172.2 lb

## 2024-03-05 DIAGNOSIS — I10 Essential (primary) hypertension: Secondary | ICD-10-CM | POA: Diagnosis not present

## 2024-03-05 DIAGNOSIS — I428 Other cardiomyopathies: Secondary | ICD-10-CM | POA: Diagnosis not present

## 2024-03-05 DIAGNOSIS — I2510 Atherosclerotic heart disease of native coronary artery without angina pectoris: Secondary | ICD-10-CM | POA: Diagnosis not present

## 2024-03-05 NOTE — Progress Notes (Signed)
 Cardiology Office Note:    Date:  03/05/2024   ID:  Gregg Summers, DOB 08-24-1941, MRN [id number]  PCP:  Louis Matte, MD   [facility] Providers Cardiologist:  None     Referring MD: Louis Matte   No chief complaint on file.  History of Present Illness:    Gregg Summers is a 83 y.o. 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  History taken limited by condition of patient.  Previously seen by [place] [facility] from a cardiac perspective.  Had an echo 02/2023 showing EF 30 to 35%.  Subsequently underwent a left heart cath 03/2023 showing minimal RCA disease, 10%.  He was started on Toprol-XL 25 mg daily, Jardiance.  Lisinopril 10 mg daily was continued.  Denies chest pain or shortness of breath.  Past Medical History:  Diagnosis Date   Arthritis    Dementia (HCC)    Hypertension    Thyroid disease     Past Surgical History:  Procedure Laterality Date   BACK SURGERY     CHOLECYSTECTOMY     JOINT REPLACEMENT     LEFT HEART CATH AND CORONARY ANGIOGRAPHY N/A 04/26/2023   Procedure: LEFT HEART CATH AND CORONARY ANGIOGRAPHY;  Surgeon: Elder Negus, MD;  Location: [facility];  Service: Cardiovascular;  Laterality: N/A;   ROTATOR CUFF REPAIR Bilateral     Current Medications: Current Meds  Medication Sig   allopurinol (ZYLOPRIM) 100 MG tablet Take 100 mg by mouth in the morning.   clopidogrel (PLAVIX) 75 MG tablet Take 1 tablet (75 mg total) by mouth daily.   levothyroxine (SYNTHROID, LEVOTHROID) 75 MCG tablet Take 75 mcg by mouth daily before breakfast.   lisinopril (PRINIVIL,ZESTRIL) 10 MG tablet Take 10 mg by mouth in the morning.   metoprolol succinate (TOPROL XL) 25 MG 24 hr tablet Take 1 tablet (25 mg total) by mouth daily.   Multiple Vitamins-Minerals (ADULT ONE DAILY GUMMIES PO) Take 2 tablets by mouth in the morning.   naproxen sodium (ALEVE) 220 MG tablet  Take 220-440 mg by mouth 2 (two) times daily as needed (pain.).     Allergies:   Septra [sulfamethoxazole-trimethoprim], Aspirin, and Ciprofloxacin   Social History   Socioeconomic History   Marital status: Unknown    Spouse name: Not on file   Number of children: Not on file   Years of education: Not on file   Highest education level: Not on file  Occupational History   Not on file  Tobacco Use   Smoking status: Never   Smokeless tobacco: Never  Substance and Sexual Activity   Alcohol use: Not Currently   Drug use: Not on file   Sexual activity: Not on file  Other Topics Concern   Not on file  Social History Narrative   Not on file   Social Drivers of Health   Financial Resource Strain: Not on file  Food Insecurity: Not on file  Transportation Needs: Not on file  Physical Activity: Not on file  Stress: Not on file  Social Connections: Not on file     Family History: The patient's family history is not on file.  ROS:   Please see the history of present illness.     All other systems reviewed and are negative.  EKGs/Labs/Other Studies Reviewed:    The following studies were reviewed today:  EKG Interpretation Date/Time:  Monday March 05 2024 10:58:23 EDT  Ventricular Rate:  62 PR Interval:  190 QRS Duration:  136 QT Interval:  460 QTC Calculation: 466 R Axis:   -83  Text Interpretation: Sinus rhythm with occasional Premature ventricular complexes Left axis deviation Right bundle branch block Inferior infarct Confirmed by Debbe Odea ([id number]) on 03/05/2024 11:10:25 AM    Recent Labs: 04/18/2023: BUN 21; Creatinine, Ser 1.08; Hemoglobin 16.3; Platelets 227; Potassium 4.6; Sodium 144  Recent Lipid Panel No results found for: "CHOL", "TRIG", "HDL", "CHOLHDL", "VLDL", "LDLCALC", "LDLDIRECT"   Risk Assessment/Calculations:              Physical Exam:    VS:  BP 120/72   Pulse 62   Ht 5\' 7"  (1.702 m)   Wt 172 lb 3.2 oz (78.1 kg)   SpO2 95%   BMI  26.97 kg/m     Wt Readings from Last 3 Encounters:  03/05/24 172 lb 3.2 oz (78.1 kg)  04/26/23 170 lb (77.1 kg)  04/13/23 168 lb 3.2 oz (76.3 kg)     GEN:  Well nourished, well developed in no acute distress HEENT: Normal NECK: No JVD; No carotid bruits CARDIAC: RRR, no murmurs, rubs, gallops RESPIRATORY:  Clear to auscultation without rales, wheezing or rhonchi  ABDOMEN: Soft, non-tender, non-distended MUSCULOSKELETAL:  No edema; No deformity  SKIN: Warm and dry NEUROLOGIC:  Alert  PSYCHIATRIC:  Normal affect   ASSESSMENT:    1. NICM (nonischemic cardiomyopathy) (HCC)   2. Primary hypertension   3. Coronary artery disease involving native coronary artery of native heart, unspecified whether angina present    PLAN:    In order of problems listed above:  Nonischemic cardiomyopathy EF 03/2023 30 to 35%.  Repeat echo.  Continue Toprol-XL 25 mg daily, lisinopril 10 mg daily, Jardiance 10 mg daily.  Consider switch to [facility] if EF stays low. Hypertension, BP controlled.  Continue Toprol-XL, lisinopril 10 mg daily. Minimal nonobstructive CAD.  On Plavix.  Given age, comorbidities, data does not justify statin at this point.  Follow-up after echo.     Medication Adjustments/Labs and Tests Ordered: Current medicines are reviewed at length with the patient today.  Concerns regarding medicines are outlined above.  Orders Placed This Encounter  Procedures   EKG 12-Lead   ECHOCARDIOGRAM COMPLETE   No orders of the defined types were placed in this encounter.   Patient Instructions  Medication Instructions:   Your Physician recommend you continue on your current medication as directed.     *If you need a refill on your cardiac medications before your next appointment, please call your pharmacy*  Lab Work:  No labs ordered today   Testing/Procedures: Your physician has requested that you have an echocardiogram. Echocardiography is a painless test that uses sound waves to  create images of your heart. It provides your doctor with information about the size and shape of your heart and how well your heart's chambers and valves are working.   You may receive an ultrasound enhancing agent through an IV if needed to better visualize your heart during the echo. This procedure takes approximately one hour.  There are no restrictions for this procedure.  This will take place at [address] [facility] [address], [place] [address]  Please note: We ask at that you not bring children with you during ultrasound (echo/ vascular) testing. Due to room size and safety concerns, children are not allowed in the ultrasound rooms during exams. Our front office staff cannot provide observation of children in  our lobby area while testing is being conducted. An adult accompanying a patient to their appointment will only be allowed in the ultrasound room at the discretion of the ultrasound technician under special circumstances. We apologize for any inconvenience.   Follow-Up: At [facility], you and your health needs are our priority.  As part of our continuing mission to provide you with exceptional heart care, our providers are all part of one team.  This team includes your primary Cardiologist (physician) and Advanced Practice Providers or APPs (Physician Assistants and Nurse Practitioners) who all work together to provide you with the care you need, when you need it.  Your next appointment:   2-3 month(s)  Provider:   You may see Debbe Odea, MD or one of the following Advanced Practice Providers on your designated Care Team:   Nicolasa Ducking, NP Ames Dura, PA-C Eula Listen, PA-C Cadence [place], PA-C Charlsie Quest, NP Carlos Levering, NP    We recommend signing up for the patient portal called "MyChart".  Sign up information is provided on this After Visit Summary.  MyChart is used to connect with patients for Virtual Visits (Telemedicine).   Patients are able to view lab/test results, encounter notes, upcoming appointments, etc.  Non-urgent messages can be sent to your provider as well.   To learn more about what you can do with MyChart, go to [email or web address].     Signed, Debbe Odea, MD  03/05/2024 1:48 PM    [facility]

## 2024-03-05 NOTE — Patient Instructions (Signed)
 Medication Instructions:   Your Physician recommend you continue on your current medication as directed.     *If you need a refill on your cardiac medications before your next appointment, please call your pharmacy*  Lab Work:  No labs ordered today   Testing/Procedures: Your physician has requested that you have an echocardiogram. Echocardiography is a painless test that uses sound waves to create images of your heart. It provides your doctor with information about the size and shape of your heart and how well your heart's chambers and valves are working.   You may receive an ultrasound enhancing agent through an IV if needed to better visualize your heart during the echo. This procedure takes approximately one hour.  There are no restrictions for this procedure.  This will take place at [address] [facility] [address], [place] [address]  Please note: We ask at that you not bring children with you during ultrasound (echo/ vascular) testing. Due to room size and safety concerns, children are not allowed in the ultrasound rooms during exams. Our front office staff cannot provide observation of children in our lobby area while testing is being conducted. An adult accompanying a patient to their appointment will only be allowed in the ultrasound room at the discretion of the ultrasound technician under special circumstances. We apologize for any inconvenience.   Follow-Up: At [facility], you and your health needs are our priority.  As part of our continuing mission to provide you with exceptional heart care, our providers are all part of one team.  This team includes your primary Cardiologist (physician) and Advanced Practice Providers or APPs (Physician Assistants and Nurse Practitioners) who all work together to provide you with the care you need, when you need it.  Your next appointment:   2-3 month(s)  Provider:   You may see Debbe Odea, MD or one of the  following Advanced Practice Providers on your designated Care Team:   Nicolasa Ducking, NP Ames Dura, PA-C Eula Listen, PA-C Cadence [place], PA-C Charlsie Quest, NP Carlos Levering, NP    We recommend signing up for the patient portal called "MyChart".  Sign up information is provided on this After Visit Summary.  MyChart is used to connect with patients for Virtual Visits (Telemedicine).  Patients are able to view lab/test results, encounter notes, upcoming appointments, etc.  Non-urgent messages can be sent to your provider as well.   To learn more about what you can do with MyChart, go to [email or web address].

## 2024-03-29 ENCOUNTER — Ambulatory Visit

## 2024-04-12 ENCOUNTER — Ambulatory Visit: Payer: Self-pay | Admitting: Cardiology

## 2024-04-12 ENCOUNTER — Ambulatory Visit: Attending: Cardiology

## 2024-04-12 DIAGNOSIS — I5020 Unspecified systolic (congestive) heart failure: Secondary | ICD-10-CM

## 2024-04-12 DIAGNOSIS — I428 Other cardiomyopathies: Secondary | ICD-10-CM | POA: Diagnosis not present

## 2024-04-12 LAB — ECHOCARDIOGRAM COMPLETE
AR max vel: 1.8 cm2
AV Area VTI: 1.64 cm2
AV Area mean vel: 1.59 cm2
AV Mean grad: 2 mmHg
AV Peak grad: 4.1 mmHg
Ao pk vel: 1.01 m/s
Calc EF: 27.8 %
S' Lateral: 3.6 cm
Single Plane A2C EF: 22.8 %
Single Plane A4C EF: 33.5 %

## 2024-04-13 MED ORDER — ENTRESTO 24-26 MG PO TABS: 1.0000 | ORAL_TABLET | Freq: Two times a day (BID) | ORAL | 3 refills | Status: DC

## 2024-04-18 ENCOUNTER — Telehealth: Payer: Self-pay | Admitting: Cardiology

## 2024-04-18 NOTE — Telephone Encounter (Signed)
 Called to confirm/remind patient of their appointment at the [facility] on 04/19/24.   Appointment:   [x] Confirmed  [] Left mess   [] No answer/No voice mail  [] VM Full/unable to leave message  [] Phone not in service  Patient reminded to bring all medications and/or complete list.  Confirmed patient has transportation. Gave directions, instructed to utilize valet parking.

## 2024-04-19 ENCOUNTER — Ambulatory Visit (HOSPITAL_BASED_OUTPATIENT_CLINIC_OR_DEPARTMENT_OTHER): Admitting: Cardiology

## 2024-04-19 ENCOUNTER — Other Ambulatory Visit
Admission: RE | Admit: 2024-04-19 | Discharge: 2024-04-19 | Disposition: A | Discharge status: Home / Self Care | Source: Ambulatory Visit | Attending: Cardiology | Admitting: Cardiology

## 2024-04-19 DIAGNOSIS — I2510 Atherosclerotic heart disease of native coronary artery without angina pectoris: Secondary | ICD-10-CM | POA: Diagnosis not present

## 2024-04-19 DIAGNOSIS — I428 Other cardiomyopathies: Secondary | ICD-10-CM

## 2024-04-19 DIAGNOSIS — I429 Cardiomyopathy, unspecified: Secondary | ICD-10-CM | POA: Diagnosis present

## 2024-04-19 DIAGNOSIS — F0390 Unspecified dementia without behavioral disturbance: Secondary | ICD-10-CM | POA: Diagnosis not present

## 2024-04-19 DIAGNOSIS — I5020 Unspecified systolic (congestive) heart failure: Secondary | ICD-10-CM | POA: Diagnosis not present

## 2024-04-19 LAB — BASIC METABOLIC PANEL WITH GFR
Anion gap: 10 (ref 5–15)
BUN: 15 mg/dL (ref 8–23)
CO2: 26 mmol/L (ref 22–32)
Calcium: 9 mg/dL (ref 8.9–10.3)
Chloride: 103 mmol/L (ref 98–111)
Creatinine, Ser: 0.88 mg/dL (ref 0.61–1.24)
GFR, Estimated: >60 mL/min (ref >60)
Glucose, Bld: 94 mg/dL (ref 70–99)
Potassium: 4 mmol/L (ref 3.5–5.1)
Sodium: 139 mmol/L (ref 135–145)

## 2024-04-19 NOTE — Patient Instructions (Addendum)
 Medication Changes:  STOP Plavix   STOP [place]sp[place]  (metoprolol )  Lab Work:  Go over to the [facility]. Go pass the gift shop and have your blood work completed.  We will only call you if the results are abnormal or if the provider would like to make medication changes.   Testing/Procedures:  Genetic testing has been collected, this has to be sent to [place]  for processing and can take 1-2 weeks for us  to get results back.  We will let you know the results once reviewed by your provider.  Special Instructions // Education:  Do the following things EVERYDAY: Weigh yourself in the morning before breakfast. Write it down and keep it in a log. Take your medicines as prescribed Eat low salt foods--Limit salt (sodium) to 2000 mg per day.  Stay as active as you can everyday Limit all fluids for the day to less than 2 liters   Follow-Up in: 6 months (November), We will call you closer to this time to schedule    If you have any questions or concerns before your next appointment please send us  a message through mychart or call our office at [phone], If it is after office hours your call will be answered by our answering service and directed appropriately.     At the [facility], you and your health needs are our priority. We have a designated team specialized in the treatment of Heart Failure. This Care Team includes your primary Heart Failure Specialized Cardiologist (physician), Advanced Practice Providers (APPs- Physician Assistants and Nurse Practitioners), and Pharmacist who all work together to provide you with the care you need, when you need it.   You may see any of the following providers on your designated Care Team at your next follow up:  Dr. Jules Oar Dr. Peder Bourdon Dr. Alwin Baars Dr. Judyth Nunnery Nieves Bars, NP Ruddy Corral, [place] [address] [place], [place] Dennise Fitz, NP [place] Lee, NP Shawnee Dellen, NP Bevely Brush,  PharmD

## 2024-04-19 NOTE — Progress Notes (Unsigned)
   ADVANCED [facility] FAILURE CLINIC NOTE  Referring Physician: Constancia Delton, MD  Primary Care: Entzminger, Iantha Mainland, MD Primary Cardiologist:  CC: Heart failure with reduced EF  HPI: Gregg Summers is a 83 y.o. 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   Gregg Summers is here today with his wife who is his primary caregiver. Unfortunately, he has become fairly limited due to advanced dementia. From a cardiac standpoint it appears he has been relatively stable. His wife does an excellent job managing his medications and volume status. On exam, he is euvolemic with no signs of decompensated heart failure. He does have a history of carpal tunnel surgery (possibly bilateral) with severe LVH on echocardiogram. Otherwise, unable to obtain much history regarding symptoms from Gregg Summers himself.   Pertinent Family & social hx:  - No known hx of cardiac amyloid or infiltrative cardiomyopathies.    PHYSICAL EXAM: 115/70, 60, 95% GENERAL: NAD Lungs- CTA CARDIAC:  JVP: 6 cm          Normal rate with regular rhythm. no murmur.  Pulses 2+. no edema.  ABDOMEN: Soft, non-tender, non-distended.  EXTREMITIES: Warm and well perfused.  NEUROLOGIC: No obvious FND   DATA REVIEW  ECG: 04/19/24: sinus bradycardia, 53 BPM, RBBB  As per my personal interpretation  ECHO: 04/12/24: LVEF 30-35%, moderately reduced RV function as per my personal interpretation  CATH: 04/26/23:  LM: Normal LAD: Normal Lcx: Codominant, normal RCA: Codominant. 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 He has significant LVH and history of bilateral carpal tunnel surgery. I discussed cardiac amyloid and its various forms with his wife extensively. At this time she is interested in pursuing genetic testing only to  rule out genetic cardiomyopathies that could affect their children. Will obtain genetic testing today.  NYHA class / AHA Stage:NYHA IIB Volume status & Diuretics: euvolemic Vasodilators: Entresto  24/26mg  BID Beta-Blocker:toprol  25mg  daily; will discontinue. He is bradycardic to the 50s with possible infiltrative cardiomyopathy. At this time will focus on quality of life.  MRA:N/A Cardiometabolic:jardiance  10mg  daily  Devices therapies & Valvulopathies:Due to advanced age and dementia he is not a good candidate for primary prevention ICD.  Advanced therapies:N/A  2. CAD - Currently on plavix  75mg  daily - D/C plavix . He is 41 with no CAD on LHC.   3. Dementia - Pleasant today during our encounter; wife is excellent primary caretaker.   4. Hypertension  - well controlled on Entresto  24/26mg  BID  Osamu Olguin Advanced Heart Failure Mechanical Circulatory Support  I spent 60 minutes caring for this patient today including face to face time, ordering and reviewing labs, reviewing records from Dr. Carl Charnley (03/05/24), discussing genetic testing, reviewing echocardiograms with family, seeing the patient, documenting in the record, and arranging follow ups.

## 2024-04-24 ENCOUNTER — Other Ambulatory Visit: Payer: Self-pay

## 2024-04-24 MED ORDER — ENTRESTO 24-26 MG PO TABS: 1.0000 | ORAL_TABLET | Freq: Two times a day (BID) | ORAL | 3 refills | Status: AC

## 2024-05-07 ENCOUNTER — Encounter: Payer: Self-pay | Admitting: Cardiology

## 2024-05-07 ENCOUNTER — Ambulatory Visit: Attending: Cardiology | Admitting: Cardiology

## 2024-05-07 VITALS — BP 110/60 | HR 74 | Ht 68.0 in | Wt 160.8 lb

## 2024-05-07 DIAGNOSIS — F0390 Unspecified dementia without behavioral disturbance: Secondary | ICD-10-CM | POA: Diagnosis not present

## 2024-05-07 DIAGNOSIS — I10 Essential (primary) hypertension: Secondary | ICD-10-CM

## 2024-05-07 DIAGNOSIS — I429 Cardiomyopathy, unspecified: Secondary | ICD-10-CM | POA: Diagnosis not present

## 2024-05-07 DIAGNOSIS — I428 Other cardiomyopathies: Secondary | ICD-10-CM | POA: Diagnosis not present

## 2024-05-07 DIAGNOSIS — I5020 Unspecified systolic (congestive) heart failure: Secondary | ICD-10-CM

## 2024-05-07 DIAGNOSIS — E039 Hypothyroidism, unspecified: Secondary | ICD-10-CM

## 2024-05-07 DIAGNOSIS — I2510 Atherosclerotic heart disease of native coronary artery without angina pectoris: Secondary | ICD-10-CM

## 2024-05-07 NOTE — Patient Instructions (Addendum)
 Medication Instructions:  Your physician recommends that you continue on your current medications as directed. Please refer to the Current Medication list given to you today.   *If you need a refill on your cardiac medications before your next appointment, please call your pharmacy*  Lab Work: No labs ordered today  If you have labs (blood work) drawn today and your tests are completely normal, you will receive your results only by: MyChart Message (if you have MyChart) OR A paper copy in the mail If you have any lab test that is abnormal or we need to change your treatment, we will call you to review the results.  Testing/Procedures: No test ordered today   Follow-Up: At [facility], you and your health needs are our priority.  As part of our continuing mission to provide you with exceptional heart care, our providers are all part of one team.  This team includes your primary Cardiologist (physician) and Advanced Practice Providers or APPs (Physician Assistants and Nurse Practitioners) who all work together to provide you with the care you need, when you need it.  Your next appointment:   4 - 5 month(s)  Provider:   Ronald Cockayne, NP

## 2024-05-07 NOTE — Progress Notes (Signed)
 Cardiology Office Note   Date:  05/07/2024  ID:  Gregg Summers, DOB 1940-12-26, MRN [id number] PCP: Gregg Brazier, MD  [facility] Providers Cardiologist:  None     History of Present Illness Gregg Summers is a 83 y.o. male with past medical history of hypertension, NICM, HFrEF with LVEF of 30-35%, advanced dementia, hypothyroidism, minimal nonobstructive coronary artery disease on left heart catheterization (03/2023), who is here today for follow-up.   Patient previously been followed by Gregg Summers with his initial visit in 03/02/2023 after an emergency department visit at [facility] due to severe back pain.  He had an incidental finding of cardiomegaly that was found on chest x-ray.  Patient not want to be admitted as he was asymptomatic.  His back pain limited him and he had a hard time walking because it was so painful.  Blood pressure was suboptimally controlled at 143/92.  Back was bothering him that day as well.  He was scheduled for an echocardiogram and no other medication changes were made at that time.  He was seen in clinic on 04/13/2023 by Dr. Braxton Calico.  At that time he was complaining of no energy, back pain, and some chest discomfort.  Echocardiogram had revealed a newly reduced LVEF.  Echocardiogram revealed EF 30-35%, severe concentric hypertrophy of the left ventricle, hypokinetic global wall motion, indeterminate diastolic filling pattern, mild mitral regurgitation.  EKG also revealed a chronic right bundle branch block with PACs at a rate of 65 bpm.  He was continued on lisinopril and Jardiance  and Toprol -XL were added to his regular regimen.  He was scheduled for a left heart catheterization at that time.  Left heart catheterization was completed on 04/26/2023 revealing normal left main, normal LAD, codominant left circumflex that was normal and negative mid 10% disease of the RCA.   Patient was seen in clinic 03/05/24 by Dr.Agbor-Etang.  At that time  he did not chest pain or shortness of breath.  He was scheduled for an updated echocardiogram.  No further medication changes made at that time.  His echocardiogram was completed on 04/12/2024 which revealed an LVEF of 30-35%.  At that time lisinopril was discontinued due to 48-hour washout and was started on Entresto  24/26 mg twice daily.  He was also referred to advanced heart failure clinic.  He followed up with advanced heart failure 04/19/2024 with Dr. Bruce Summers.  He appeared to be relatively stable from the cardiac standpoint.  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  Clopidogrel  was also discontinued as he had very minimal nonobstructive disease on his left heart catheterization.  Blood pressure was well-controlled on Entresto  24/26 mg twice daily.  He returns to clinic today accompanied by his wife who did also talking during his appointment today.  Patient's baseline is advancing dementia.  She is concerned about continued weight loss as he has lost approximately 12 pounds since April from decreased appetite.  She states that she has brought it up with his primary care provider who told her that this was progression of his dementia.  She states over the last 2 weeks they have had fevers off and on, a cold and lots of coughing.  She stated that he had been evaluated and was negative for COVID, flu, RSV.  She stated that he had previously been extremely sleepy all the time so she had stopped his antihistamine and his Jardiance .  Since that time he has been a little perkier.  She states that he had followed up with Dr. Bruce Summers who had previously discontinued several of his medications.  She denies any hospitalizations or visits to the emergency department.  ROS: 10 point review of systems has been reviewed and considered negative with exception ones are listed in the HPI  Studies  Reviewed EKG Interpretation Date/Time:  Monday May 07 2024 13:26:50 EDT Ventricular Rate:  74 PR Interval:  186 QRS Duration:  144 QT Interval:  444 QTC Calculation: 492 R Axis:   -82  Text Interpretation: Sinus rhythm with Premature atrial complexes Left axis deviation Right bundle branch block Septal infarct (cited on or before 05-Mar-2024) Inferior infarct , age undetermined When compared with ECG of 19-Apr-2024 12:28, No significant change was found Confirmed by Ronald Cockayne ([id number]) on 05/07/2024 1:30:27 PM    2D echo 04/12/2024 1. Left ventricular ejection fraction, by estimation, is 30 to 35%. Left  ventricular ejection fraction by 2D MOD biplane is 27.8 %. The left  ventricle has moderately decreased function. The left ventricle  demonstrates global hypokinesis with severe  hypokinesis of the basal to mid inferior wall. There is moderate left  ventricular hypertrophy.   2. Right ventricular systolic function is moderately reduced. The right  ventricular size is normal.   3. The mitral valve is normal in structure. Mild mitral valve  regurgitation. No evidence of mitral stenosis.   4. The aortic valve is tricuspid. Aortic valve regurgitation is not  visualized. Aortic valve sclerosis/calcification is present, without any  evidence of aortic stenosis.   5. The inferior vena cava is normal in size with greater than 50%  respiratory variability, suggesting right atrial pressure of 3 mmHg.   LHC 04/26/2023 LM: Normal LAD: Normal Lcx: Codominant, normal RCA: Codominant. Mid 10% disease   LVEDP 21 mmHg   Nonischemic cardiomyopathy Consider noncardiac cause for chest pain   Risk Assessment/Calculations           Physical Exam VS:  BP 110/60 (BP Location: Left Arm, Patient Position: Sitting, Cuff Size: Normal)   Pulse 74   Ht 5\' 8"  (1.727 m)   Wt 160 lb 12.8 oz (72.9 kg)   SpO2 98%   BMI 24.45 kg/m    Wt Readings from Last 3 Encounters:  05/07/24 160 lb 12.8 oz (72.9  kg)  03/05/24 172 lb 3.2 oz (78.1 kg)  04/26/23 170 lb (77.1 kg)    GEN: Well nourished, well developed in no acute distress NECK: No JVD; No carotid bruits CARDIAC: RRR, no murmurs, rubs, gallops RESPIRATORY:  Clear to auscultation without rales, wheezing or rhonchi  ABDOMEN: Soft, non-tender, non-distended EXTREMITIES:  No edema; No deformity   ASSESSMENT AND PLAN HFrEF/nonischemic cardiomyopathy with an EF of 30-35% noted on recent echocardiogram.  He had recently followed up with advanced heart failure clinic.  He is continued on Entresto  24/26 mg twice daily.  Appears to be euvolemic on exam and suffering from NYHA class II-III symptoms.  Wife has recently discontinued Jardiance  due to side effects.  Previously was taken off of beta-blocker therapy due to baseline bradycardia.  Upon further discussion with his wife there were no medication changes made today.  He has been encouraged to maintain follow-up appointments with advanced heart failure clinic as well.  Primary hypertension with a blood pressure today of 110/60.  Blood pressure has remained stable.  He has continued on Entresto  24/26 mg twice daily.  They have been encouraged to  continue to monitor the blood pressures 1 to 2 hours postmedication administration as well.  Minimal nonobstructive coronary artery disease on left heart catheterization with 10% stenosis noted in the RCA.  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  Recently his clopidogrel  was discontinued.  Given his age, comorbidities, data does not justify statin therapy at this time.  Hypothyroidism where he is continued on Synthroid 75 mcg daily.  Has ongoing management per his PCP.  Dementia that appears to be progressing.  He has not needed any medications at this time.  He has had weight loss of 12 pounds since August.  Advised his wife to  continue to feed him the foods that he will eat at and try to incorporate boost in between meals or even as desired and milkshakes with protein as long as he continues to eat.       Dispo: Patient to return to clinic to see MD/APP in 4 to 5 months or sooner if needed for further evaluation  Signed, Avrielle Fry, NP

## 2024-09-06 ENCOUNTER — Ambulatory Visit: Attending: Cardiology | Admitting: Cardiology

## 2024-09-06 ENCOUNTER — Encounter: Payer: Self-pay | Admitting: Cardiology

## 2024-09-06 VITALS — BP 112/66 | HR 66 | Ht 68.0 in | Wt 176.6 lb

## 2024-09-06 DIAGNOSIS — I429 Cardiomyopathy, unspecified: Secondary | ICD-10-CM | POA: Diagnosis not present

## 2024-09-06 DIAGNOSIS — E039 Hypothyroidism, unspecified: Secondary | ICD-10-CM

## 2024-09-06 DIAGNOSIS — I10 Essential (primary) hypertension: Secondary | ICD-10-CM

## 2024-09-06 DIAGNOSIS — I5020 Unspecified systolic (congestive) heart failure: Secondary | ICD-10-CM

## 2024-09-06 DIAGNOSIS — I2510 Atherosclerotic heart disease of native coronary artery without angina pectoris: Secondary | ICD-10-CM | POA: Diagnosis not present

## 2024-09-06 DIAGNOSIS — I428 Other cardiomyopathies: Secondary | ICD-10-CM

## 2024-09-06 DIAGNOSIS — F0390 Unspecified dementia without behavioral disturbance: Secondary | ICD-10-CM | POA: Diagnosis not present

## 2024-09-06 NOTE — Progress Notes (Signed)
 Cardiology Office Note   Date:  09/06/2024  ID:  Gregg Summers, DOB October 12, 1941, MRN [id number] PCP: Sampson Ethridge LABOR, MD  [facility] Providers Cardiologist:  Redell Cave, MD Cardiology APP:  Gerard Frederick, NP  Advanced Heart Failure:  Ria Commander, DO     History of Present Illness Gregg Summers is a 83 y.o. male with a past medical history of hypertension, NICM, HFrEF with an LVEF of 30 to 35%, advanced dementia, hypothyroidism, minimal nonobstructive coronary artery disease on left heart catheterization open (03/2023), who is here today for follow-up.   Patient previously been followed by [place] [facility] with his initial visit in 03/02/2023 after an emergency department visit at [facility] due to severe back pain.  He had an incidental finding of cardiomegaly that was found on chest x-ray.  Patient not want to be admitted as he was asymptomatic.  His back pain limited him and he had a hard time walking because it was so painful.  Blood pressure was suboptimally controlled at 143/92.  Back was bothering him that day as well.  He was scheduled for an echocardiogram and no other medication changes were made at that time.  He was seen in clinic on 04/13/2023 by Dr. Dewane.  At that time he was complaining of no energy, back pain, and some chest discomfort.  Echocardiogram had revealed a newly reduced LVEF.  Echocardiogram revealed EF 30-35%, severe concentric hypertrophy of the left ventricle, hypokinetic global wall motion, indeterminate diastolic filling pattern, mild mitral regurgitation.  EKG also revealed a chronic right bundle branch block with PACs at a rate of 65 bpm.  He was continued on lisinopril and Jardiance  and Toprol -XL were added to his regular regimen.  He was scheduled for a left heart catheterization at that time.  Left heart catheterization was completed on 04/26/2023 revealing normal left main, normal LAD, codominant left circumflex that was  normal and negative mid 10% disease of the RCA.   He was last seen in clinic 05/07/2024.  He has baseline dementia was advancing.  His wife was with him and was concerned about his continued weight loss.  Several of his medications have recently been discontinued due to his advanced heart failure follow-up.  There were no medication changes that were made during his appointment and further testing that was ordered.  He returns to clinic today accompanied by his wife.  Overall from a cardiac perspective he has been doing well.  He denies any chest pain or shortness of breath.  His weight is up but his appetite is also increased.  His wife does the majority of the talking during his appointment.  He denies seeing any issues with breathing and continues to walk does walk slower.  He does have a sweet tooth for little Debbie cakes.  He has been compliant with his current medication regimen.  Denies any recent hospitalizations or visits to the emergency department.  ROS: 10 point review of systems has been reviewed and considered negative the exception was been listed in the HPI  Studies Reviewed EKG Interpretation Date/Time:  Thursday September 06 2024 13:30:34 EDT Ventricular Rate:  66 PR Interval:  200 QRS Duration:  144 QT Interval:  450 QTC Calculation: 471 R Axis:   250  Text Interpretation: Sinus rhythm with Premature atrial complexes with Abberant conduction Right bundle branch block No significant change since last tracing Confirmed by Gerard Frederick ([id number]) on 09/06/2024 1:36:17 PM    2D echo 04/12/2024 1. Left ventricular  ejection fraction, by estimation, is 30 to 35%. Left  ventricular ejection fraction by 2D MOD biplane is 27.8 %. The left  ventricle has moderately decreased function. The left ventricle  demonstrates global hypokinesis with severe  hypokinesis of the basal to mid inferior wall. There is moderate left  ventricular hypertrophy.   2. Right ventricular systolic function is  moderately reduced. The right  ventricular size is normal.   3. The mitral valve is normal in structure. Mild mitral valve  regurgitation. No evidence of mitral stenosis.   4. The aortic valve is tricuspid. Aortic valve regurgitation is not  visualized. Aortic valve sclerosis/calcification is present, without any  evidence of aortic stenosis.   5. The inferior vena cava is normal in size with greater than 50%  respiratory variability, suggesting right atrial pressure of 3 mmHg.    LHC 04/26/2023 LM: Normal LAD: Normal Lcx: Codominant, normal RCA: Codominant. Mid 10% disease   LVEDP 21 mmHg   Nonischemic cardiomyopathy Consider noncardiac cause for chest pain Risk Assessment/Calculations           Physical Exam VS:  BP 112/66   Pulse 66   Ht 5' 8 (1.727 m)   Wt 176 lb 9.6 oz (80.1 kg)   SpO2 100%   BMI 26.85 kg/m        Wt Readings from Last 3 Encounters:  09/06/24 176 lb 9.6 oz (80.1 kg)  05/07/24 160 lb 12.8 oz (72.9 kg)  03/05/24 172 lb 3.2 oz (78.1 kg)    GEN: Well nourished, well developed in no acute distress NECK: No JVD; No carotid bruits CARDIAC: RRR, no murmurs, rubs, gallops RESPIRATORY:  Clear to auscultation without rales, wheezing or rhonchi  ABDOMEN: Soft, non-tender, non-distended EXTREMITIES:  No edema; No deformity   ASSESSMENT AND PLAN HFrEF/nonischemic cardiomyopathy with an EF of 30 to 35% noted on his last echocardiogram.  Previously has followed with advanced heart failure clinic.  He is continued on Entresto  24/26 mg twice daily.  He continues to remain euvolemic on exam.  Continues to suffer from NYHA class II symptoms.  Previously had discontinued several medications due to side effects and complications.  He has been encouraged to continue with his activity, weigh daily, and monitor for his activity and weight gain.  He has also been encouraged to continue to maintain all follow-up appointments with advanced heart failure clinic.  Primary  hypertension with blood pressure today 112/66.  Blood pressures remain stable.  He is continued on Entresto  24/26 mg twice daily.  They have been encouraged to continue to monitor his pressures 1 to 2 hours postmedication administration at home as well.  Minimal nonobstructive coronary artery disease on left heart catheterization with 10% stenosis noted to the RCA.  EKG today reveals sinus rhythm with a rate 88 of 66 with a PAC and a right bundle branch block that is unchanged from prior studies.  No ischemic changes noted.  No further ischemic testing needed at this time.  Hypothyroidism was continued on Synthroid 75 mcg.  Ongoing management per his PCP.  Dementia that appears to be slowly progressing.  He has had a weight gain of 16 pounds in the past 4 to 5 months.  Advised his wife to continue with offering meals and keeping his day-to-day activities as consistent as possible.       Dispo: Patient to return to clinic to see MD/APP in 6 months or sooner if needed for further evaluation.  Signed, Savalas Monje, NP

## 2024-09-06 NOTE — Patient Instructions (Signed)

## 2025-02-28 ENCOUNTER — Ambulatory Visit: Admitting: Cardiology
